# Patient Record
Sex: Male | Born: 1955 | Race: White | Hispanic: No | Marital: Married | Smoking: Former smoker
Health system: Southern US, Community
[De-identification: ages and names within clinical notes are randomized; demographics above are authoritative.]

## PROBLEM LIST (undated history)

## (undated) DIAGNOSIS — H9319 Tinnitus, unspecified ear: Secondary | ICD-10-CM

## (undated) DIAGNOSIS — E7800 Pure hypercholesterolemia, unspecified: Secondary | ICD-10-CM

## (undated) DIAGNOSIS — C679 Malignant neoplasm of bladder, unspecified: Secondary | ICD-10-CM

## (undated) DIAGNOSIS — F32A Depression, unspecified: Secondary | ICD-10-CM

## (undated) DIAGNOSIS — Z789 Other specified health status: Secondary | ICD-10-CM

## (undated) DIAGNOSIS — Z8616 Personal history of COVID-19: Secondary | ICD-10-CM

## (undated) DIAGNOSIS — K219 Gastro-esophageal reflux disease without esophagitis: Secondary | ICD-10-CM

## (undated) DIAGNOSIS — F329 Major depressive disorder, single episode, unspecified: Secondary | ICD-10-CM

## (undated) DIAGNOSIS — M1990 Unspecified osteoarthritis, unspecified site: Secondary | ICD-10-CM

## (undated) DIAGNOSIS — N281 Cyst of kidney, acquired: Secondary | ICD-10-CM

## (undated) DIAGNOSIS — Z9889 Other specified postprocedural states: Secondary | ICD-10-CM

## (undated) DIAGNOSIS — F419 Anxiety disorder, unspecified: Secondary | ICD-10-CM

## (undated) HISTORY — DX: Unspecified osteoarthritis, unspecified site: M19.90

## (undated) HISTORY — PX: ANKLE ARTHRODESIS: SUR49

## (undated) HISTORY — DX: Gastro-esophageal reflux disease without esophagitis: K21.9

## (undated) HISTORY — PX: BLADDER SURGERY: SHX569

## (undated) HISTORY — DX: Cyst of kidney, acquired: N28.1

## (undated) HISTORY — DX: Malignant neoplasm of bladder, unspecified: C67.9

## (undated) HISTORY — DX: Pure hypercholesterolemia, unspecified: E78.00

## (undated) HISTORY — PX: COLONOSCOPY: SHX174

## (undated) HISTORY — PX: RETINAL DETACHMENT SURGERY: SHX105

---

## 2009-04-25 HISTORY — PX: COLONOSCOPY: SHX174

## 2011-06-13 ENCOUNTER — Other Ambulatory Visit: Payer: Self-pay | Admitting: Orthopedic Surgery

## 2011-06-14 ENCOUNTER — Encounter (HOSPITAL_BASED_OUTPATIENT_CLINIC_OR_DEPARTMENT_OTHER): Payer: Self-pay | Admitting: *Deleted

## 2011-06-14 NOTE — Progress Notes (Signed)
No labs needed

## 2011-06-15 ENCOUNTER — Ambulatory Visit (HOSPITAL_BASED_OUTPATIENT_CLINIC_OR_DEPARTMENT_OTHER)
Admission: RE | Admit: 2011-06-15 | Discharge: 2011-06-15 | Disposition: A | Discharge status: Home / Self Care | Payer: Worker's Compensation | Source: Ambulatory Visit | Attending: Orthopedic Surgery | Admitting: Orthopedic Surgery

## 2011-06-15 ENCOUNTER — Encounter (HOSPITAL_BASED_OUTPATIENT_CLINIC_OR_DEPARTMENT_OTHER): Payer: Self-pay | Admitting: Anesthesiology

## 2011-06-15 ENCOUNTER — Ambulatory Visit (HOSPITAL_BASED_OUTPATIENT_CLINIC_OR_DEPARTMENT_OTHER): Payer: Worker's Compensation | Admitting: Anesthesiology

## 2011-06-15 ENCOUNTER — Encounter (HOSPITAL_BASED_OUTPATIENT_CLINIC_OR_DEPARTMENT_OTHER): Payer: Self-pay | Admitting: Orthopedic Surgery

## 2011-06-15 ENCOUNTER — Encounter (HOSPITAL_BASED_OUTPATIENT_CLINIC_OR_DEPARTMENT_OTHER)
Admission: RE | Disposition: A | Discharge status: Home / Self Care | Payer: Self-pay | Source: Ambulatory Visit | Attending: Orthopedic Surgery

## 2011-06-15 DIAGNOSIS — Y9269 Other specified industrial and construction area as the place of occurrence of the external cause: Secondary | ICD-10-CM | POA: Insufficient documentation

## 2011-06-15 DIAGNOSIS — IMO0002 Reserved for concepts with insufficient information to code with codable children: Secondary | ICD-10-CM | POA: Insufficient documentation

## 2011-06-15 DIAGNOSIS — W230XXA Caught, crushed, jammed, or pinched between moving objects, initial encounter: Secondary | ICD-10-CM | POA: Insufficient documentation

## 2011-06-15 DIAGNOSIS — S6710XA Crushing injury of unspecified finger(s), initial encounter: Secondary | ICD-10-CM | POA: Insufficient documentation

## 2011-06-15 DIAGNOSIS — F17200 Nicotine dependence, unspecified, uncomplicated: Secondary | ICD-10-CM | POA: Insufficient documentation

## 2011-06-15 DIAGNOSIS — Y990 Civilian activity done for income or pay: Secondary | ICD-10-CM | POA: Insufficient documentation

## 2011-06-15 HISTORY — DX: Other specified health status: Z78.9

## 2011-06-15 HISTORY — PX: ORIF FINGER FRACTURE: SHX2122

## 2011-06-15 HISTORY — DX: Depression, unspecified: F32.A

## 2011-06-15 HISTORY — DX: Major depressive disorder, single episode, unspecified: F32.9

## 2011-06-15 SURGERY — OPEN REDUCTION INTERNAL FIXATION (ORIF) METACARPAL (FINGER) FRACTURE
Anesthesia: General | Site: Finger | Laterality: Right | Wound class: Clean

## 2011-06-15 MED ORDER — HYDROCODONE-ACETAMINOPHEN 5-500 MG PO TABS: 1.0000 | ORAL_TABLET | ORAL | Status: AC | PRN

## 2011-06-15 MED ORDER — MORPHINE SULFATE 2 MG/ML IJ SOLN: 0.0500 mg/kg | INTRAMUSCULAR | Status: DC | PRN

## 2011-06-15 MED ORDER — CHLORHEXIDINE GLUCONATE 4 % EX LIQD: 60.0000 mL | Freq: Once | CUTANEOUS | Status: DC

## 2011-06-15 MED ORDER — FENTANYL CITRATE 0.05 MG/ML IJ SOLN: 25.0000 ug | INTRAMUSCULAR | Status: DC | PRN

## 2011-06-15 MED ORDER — ROPIVACAINE HCL 5 MG/ML IJ SOLN
INTRAMUSCULAR | Status: DC | PRN
  Administered 2011-06-15: 30 mL via EPIDURAL

## 2011-06-15 MED ORDER — METOCLOPRAMIDE HCL 5 MG/ML IJ SOLN: 10.0000 mg | Freq: Once | INTRAMUSCULAR | Status: DC | PRN

## 2011-06-15 MED ORDER — CEFAZOLIN SODIUM 1-5 GM-% IV SOLN
1.0000 g | INTRAVENOUS | Status: AC
  Administered 2011-06-15: 1000 mg via INTRAVENOUS

## 2011-06-15 MED ORDER — LIDOCAINE HCL (CARDIAC) 20 MG/ML IV SOLN
INTRAVENOUS | Status: DC | PRN
  Administered 2011-06-15: 40 mg via INTRAVENOUS

## 2011-06-15 MED ORDER — DEXAMETHASONE SODIUM PHOSPHATE 10 MG/ML IJ SOLN
INTRAMUSCULAR | Status: DC | PRN
  Administered 2011-06-15: 10 mg via INTRAVENOUS

## 2011-06-15 MED ORDER — 0.9 % SODIUM CHLORIDE (POUR BTL) OPTIME
TOPICAL | Status: DC | PRN
  Administered 2011-06-15: 100 mL

## 2011-06-15 MED ORDER — LACTATED RINGERS IV SOLN
INTRAVENOUS | Status: DC
  Administered 2011-06-15 (×2): via INTRAVENOUS

## 2011-06-15 MED ORDER — CEFAZOLIN SODIUM-DEXTROSE 2-3 GM-% IV SOLR: 2.0000 g | INTRAVENOUS | Status: DC

## 2011-06-15 MED ORDER — MIDAZOLAM HCL 2 MG/2ML IJ SOLN
0.5000 mg | INTRAMUSCULAR | Status: DC | PRN
  Administered 2011-06-15: 2 mg via INTRAVENOUS

## 2011-06-15 MED ORDER — EPHEDRINE SULFATE 50 MG/ML IJ SOLN
INTRAMUSCULAR | Status: DC | PRN
  Administered 2011-06-15: 10 mg via INTRAVENOUS

## 2011-06-15 MED ORDER — LIDOCAINE HCL 1 % IJ SOLN
INTRAMUSCULAR | Status: DC | PRN
  Administered 2011-06-15: 2 mL via INTRADERMAL

## 2011-06-15 MED ORDER — FENTANYL CITRATE 0.05 MG/ML IJ SOLN
50.0000 ug | INTRAMUSCULAR | Status: DC | PRN
  Administered 2011-06-15: 100 ug via INTRAVENOUS

## 2011-06-15 MED ORDER — ONDANSETRON HCL 4 MG/2ML IJ SOLN
INTRAMUSCULAR | Status: DC | PRN
  Administered 2011-06-15: 4 mg via INTRAVENOUS

## 2011-06-15 MED ORDER — PROPOFOL 10 MG/ML IV EMUL
INTRAVENOUS | Status: DC | PRN
  Administered 2011-06-15: 200 mg via INTRAVENOUS

## 2011-06-15 SURGICAL SUPPLY — 51 items
BANDAGE GAUZE ELAST BULKY 4 IN (GAUZE/BANDAGES/DRESSINGS) IMPLANT
BIT DRILL SOLID 1.1X60 (BIT) ×2 IMPLANT
BLADE MINI RND TIP GREEN BEAV (BLADE) ×2 IMPLANT
BLADE SURG 15 STRL LF DISP TIS (BLADE) ×1 IMPLANT
BLADE SURG 15 STRL SS (BLADE) ×1
BNDG COHESIVE 3X5 TAN STRL LF (GAUZE/BANDAGES/DRESSINGS) ×2 IMPLANT
BNDG ESMARK 4X9 LF (GAUZE/BANDAGES/DRESSINGS) ×2 IMPLANT
CHLORAPREP W/TINT 26ML (MISCELLANEOUS) ×2 IMPLANT
CLOTH BEACON ORANGE TIMEOUT ST (SAFETY) ×2 IMPLANT
CORDS BIPOLAR (ELECTRODE) ×2 IMPLANT
COVER MAYO STAND STRL (DRAPES) ×2 IMPLANT
COVER TABLE BACK 60X90 (DRAPES) ×2 IMPLANT
CUFF TOURNIQUET SINGLE 18IN (TOURNIQUET CUFF) ×2 IMPLANT
DECANTER SPIKE VIAL GLASS SM (MISCELLANEOUS) IMPLANT
DRAPE EXTREMITY T 121X128X90 (DRAPE) ×2 IMPLANT
DRAPE OEC MINIVIEW 54X84 (DRAPES) ×2 IMPLANT
DRAPE SURG 17X23 STRL (DRAPES) ×2 IMPLANT
GAUZE XEROFORM 1X8 LF (GAUZE/BANDAGES/DRESSINGS) ×2 IMPLANT
GLOVE BIO SURGEON STRL SZ 6.5 (GLOVE) ×2 IMPLANT
GLOVE BIOGEL PI IND STRL 8.5 (GLOVE) ×1 IMPLANT
GLOVE BIOGEL PI INDICATOR 8.5 (GLOVE) ×1
GLOVE SURG ORTHO 8.0 STRL STRW (GLOVE) ×2 IMPLANT
GOWN BRE IMP PREV XXLGXLNG (GOWN DISPOSABLE) ×2 IMPLANT
GOWN PREVENTION PLUS XLARGE (GOWN DISPOSABLE) ×2 IMPLANT
NEEDLE 27GAX1X1/2 (NEEDLE) IMPLANT
NS IRRIG 1000ML POUR BTL (IV SOLUTION) ×2 IMPLANT
PACK BASIN DAY SURGERY FS (CUSTOM PROCEDURE TRAY) ×2 IMPLANT
PAD CAST 3X4 CTTN HI CHSV (CAST SUPPLIES) ×1 IMPLANT
PADDING CAST ABS 4INX4YD NS (CAST SUPPLIES) ×1
PADDING CAST ABS COTTON 4X4 ST (CAST SUPPLIES) ×1 IMPLANT
PADDING CAST COTTON 3X4 STRL (CAST SUPPLIES) ×1
PLATE CONDYLAR 1.5MM 7H 36MM R (Plate) ×2 IMPLANT
SCREW CORTEX 1.5X12 (Screw) ×2 IMPLANT
SCREW CORTEX 1.5X18 (Screw) ×2 IMPLANT
SCREW CORTEX 1.5X9 (Screw) ×4 IMPLANT
SLEEVE SCD COMPRESS KNEE MED (MISCELLANEOUS) ×2 IMPLANT
SPLINT PLASTER CAST XFAST 3X15 (CAST SUPPLIES) ×10 IMPLANT
SPLINT PLASTER XTRA FASTSET 3X (CAST SUPPLIES) ×10
SPONGE GAUZE 4X4 12PLY (GAUZE/BANDAGES/DRESSINGS) ×2 IMPLANT
STOCKINETTE 4X48 STRL (DRAPES) ×2 IMPLANT
SUT CHROMIC 5 0 P 3 (SUTURE) IMPLANT
SUT CHROMIC 6 0 G 1 (SUTURE) ×2 IMPLANT
SUT MERSILENE 4 0 P 3 (SUTURE) IMPLANT
SUT VICRYL 4-0 PS2 18IN ABS (SUTURE) ×2 IMPLANT
SUT VICRYL RAPID 5 0 P 3 (SUTURE) IMPLANT
SUT VICRYL RAPIDE 4/0 PS 2 (SUTURE) ×2 IMPLANT
SYR BULB 3OZ (MISCELLANEOUS) ×2 IMPLANT
SYR CONTROL 10ML LL (SYRINGE) IMPLANT
TOWEL OR 17X24 6PK STRL BLUE (TOWEL DISPOSABLE) ×2 IMPLANT
UNDERPAD 30X30 INCONTINENT (UNDERPADS AND DIAPERS) ×2 IMPLANT
WATER STERILE IRR 1000ML POUR (IV SOLUTION) ×2 IMPLANT

## 2011-06-15 NOTE — Op Note (Signed)
Dictated number: [id number]

## 2011-06-15 NOTE — Anesthesia Preprocedure Evaluation (Signed)
Anesthesia Evaluation  Patient identified by MRN, date of birth, ID band Patient awake    Reviewed: Allergy & Precautions, H&P , NPO status , Patient's Chart, lab work & pertinent test results, reviewed documented beta blocker date and time   Airway Mallampati: II TM Distance: >3 FB Neck ROM: full    Dental   Pulmonary Current Smoker,          Cardiovascular neg cardio ROS     Neuro/Psych PSYCHIATRIC DISORDERS Negative Neurological ROS     GI/Hepatic negative GI ROS, Neg liver ROS,   Endo/Other  Negative Endocrine ROS  Renal/GU negative Renal ROS  Genitourinary negative   Musculoskeletal   Abdominal   Peds  Hematology negative hematology ROS (+)   Anesthesia Other Findings See surgeon's H&P   Reproductive/Obstetrics negative OB ROS                           Anesthesia Physical Anesthesia Plan  ASA: II  Anesthesia Plan: General   Post-op Pain Management: MAC Combined w/ Regional for Post-op pain   Induction: Intravenous  Airway Management Planned: LMA  Additional Equipment:   Intra-op Plan:   Post-operative Plan: Extubation in OR  Informed Consent: I have reviewed the patients History and Physical, chart, labs and discussed the procedure including the risks, benefits and alternatives for the proposed anesthesia with the patient or authorized representative who has indicated his/her understanding and acceptance.     Plan Discussed with: CRNA and Surgeon  Anesthesia Plan Comments:         Anesthesia Quick Evaluation

## 2011-06-15 NOTE — Discharge Instructions (Signed)
[facility]  [address] [place], [place] [address] [phone]   Post Anesthesia Home Care Instructions  Activity: Get plenty of rest for the remainder of the day. A responsible adult should stay with you for 24 hours following the procedure.  For the next 24 hours, DO NOT: -Drive a car -Advertising copywriter -Drink alcoholic beverages -Take any medication unless instructed by your physician -Make any legal decisions or sign important papers.  Meals: Start with liquid foods such as gelatin or soup. Progress to regular foods as tolerated. Avoid greasy, spicy, heavy foods. If nausea and/or vomiting occur, drink only clear liquids until the nausea and/or vomiting subsides. Call your physician if vomiting continues.  Special Instructions/Symptoms: Your throat may feel dry or sore from the anesthesia or the breathing tube placed in your throat during surgery. If this causes discomfort, gargle with warm salt water. The discomfort should disappear within 24 hours.        HAND SURGERY    HOME CARE INSTRUCTIONS    The following instructions have been prepared to help you care for yourself upon your return home today.  Wound Care:  Keep your hand elevated above the level of your heart. Do not allow it to dangle by your side. Keep the dressing dry and do not remove it unless your doctor advises you to do so. He will usually change it at the time of you post-op visit. Moving your fingers is advised to stimulate circulation but will depend on the site of your surgery. Of course, if you have a splint applied your doctor will advise you about movement.  Activity:  Do not drive or operate machinery today. Rest today and then you may return to your normal activity and work as indicated by your physician.  Diet: Drink liquids today or eat a light diet. You may resume a regular diet tomorrow.  General expectations: Pain for two or three days. Fingers may become slightly  swollen.   Unexpected Observations- Call your doctor if any of these occur: Severe pain not relieved by pain medication. Elevated temperature. Dressing soaked with blood. Inability to move fingers. White or bluish color to fingers.  Return to office: ***     Regional Anesthesia Blocks  1. Numbness or the inability to move the "blocked" extremity may last from 3-48 hours after placement. The length of time depends on the medication injected and your individual response to the medication. If the numbness is not going away after 48 hours, call your surgeon.  2. The extremity that is blocked will need to be protected until the numbness is gone and the  Strength has returned. Because you cannot feel it, you will need to take extra care to avoid injury. Because it may be weak, you may have difficulty moving it or using it. You may not know what position it is in without looking at it while the block is in effect.  3. For blocks in the legs and feet, returning to weight bearing and walking needs to be done carefully. You will need to wait until the numbness is entirely gone and the strength has returned. You should be able to move your leg and foot normally before you try and bear weight or walk. You will need someone to be with you when you first try to ensure you do not fall and possibly risk injury.  4. Bruising and tenderness at the needle site are common side effects and will resolve in a few  days.  5. Persistent numbness or new problems with movement should be communicated to the surgeon or the [facility] [phone]).

## 2011-06-15 NOTE — Anesthesia Postprocedure Evaluation (Signed)
Anesthesia Post Note  Patient: Roger Vega  Procedure(s) Performed: Procedure(s) (LRB): OPEN REDUCTION INTERNAL FIXATION (ORIF) METACARPAL (FINGER) FRACTURE (Right)  Anesthesia type: General  Patient location: PACU  Post pain: Pain level controlled  Post assessment: Patient's Cardiovascular Status Stable  Last Vitals:  Filed Vitals:   06/15/11 1645  BP: 138/95  Pulse: 79  Temp:   Resp: 19    Post vital signs: Reviewed and stable  Level of consciousness: alert  Complications: No apparent anesthesia complications

## 2011-06-15 NOTE — Progress Notes (Signed)
Assisted Dr. Frederick with right, ultrasound guided, supraclavicular block. Side rails up, monitors on throughout procedure. See vital signs in flow sheet. Tolerated Procedure well. 

## 2011-06-15 NOTE — Brief Op Note (Signed)
06/15/2011  3:56 PM  PATIENT:  Roger Vega  56 y.o. male  PRE-OPERATIVE DIAGNOSIS:  Fracture proximal phalanx right little finger  POST-OPERATIVE DIAGNOSIS:  Fracture proximal phalanx right little finger  PROCEDURE:  Procedure(s) (LRB): OPEN REDUCTION INTERNAL FIXATION (ORIF) METACARPAL (FINGER) FRACTURE (Right)  SURGEON:  Surgeon(s) and Role:    * Nicki Reaper, MD - Primary  PHYSICIAN ASSISTANT:   ASSISTANTS: none   ANESTHESIA:   none  EBL:  Total I/O In: 1000 [I.V.:1000] Out: -   BLOOD ADMINISTERED:none  DRAINS: none   LOCAL MEDICATIONS USED:  NONE  SPECIMEN:  No Specimen  DISPOSITION OF SPECIMEN:  N/A  COUNTS:  YES  TOURNIQUET:   Total Tourniquet Time Documented: Upper Arm (Right) - 49 minutes  DICTATION: .Other Dictation: Dictation Number [id number]  PLAN OF CARE: Discharge to home after PACU  PATIENT DISPOSITION:  PACU - hemodynamically stable.

## 2011-06-15 NOTE — Op Note (Signed)

## 2011-06-15 NOTE — H&P (Signed)
Roger Vega is a 56 year-old right-hand dominant male referred by Dr. Thurston Hole for consultation with respect to an injury to his right little finger. He suffered a crush injury when it was caught between the bed of a truck and a steel plate.  The injury occurred on 06/03/11.  He was originally seen at [facility] and referred to Dr. Thurston Hole for consultation.  X-rays reveal a fracture of his proximal phalanx of his right little finger with angulation.  He has no prior history of injuries; he has no history of diabetes, thyroid problems, arthritis or gout.  There is a family history of diabetes, he has been tested.  He complains of an intermittent, mild aching type pain with a feeling of swelling.  He has a prior history of injury to the finger with the inability to fully extend with a loss of approximately 55 degrees to the PIP joint. He maintains the finger in hyperextended position at this point in time at the metacarpophalangeal joint with a flexed position of the PIP joint.   ALLERGIES:   Nonsteroidal anti-inflammatories.    MEDICATIONS:    Zoloft.  SURGICAL HISTORY:    Ankle surgery 13 years ago.    FAMILY MEDICAL HISTORY:   Positive for diabetes, heart disease and high blood pressure.  SOCIAL HISTORY:     He does not smoke or drink.  He is married.  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.  REVIEW OF SYSTEMS:   Positive for ringing in his ears, glasses, otherwise negative 14 points.  Roger Vega is an 56 y.o. male.   Chief Complaint: fracture Rt little finger HPI: see above  Past Medical History  Diagnosis Date  . Depression   . No pertinent past medical history     Past Surgical History  Procedure Date  . Ankle arthrodesis     age 45-bone frag,cyst-rt  . Colonoscopy     History reviewed. No pertinent family history. Social History:  reports that he quit smoking about 35 years ago. He does not have any smokeless tobacco history on file. He reports that he does not drink alcohol  or use illicit drugs.  Allergies:  Allergies  Allergen Reactions  . Nsaids Anaphylaxis    No current facility-administered medications on file as of .   Medications Prior to Admission  Medication Sig Dispense Refill  . acetaminophen (TYLENOL) 325 MG tablet Take 650 mg by mouth every 6 (six) hours as needed.      . Multiple Vitamin (MULTIVITAMIN) capsule Take 1 capsule by mouth daily.      . sertraline (ZOLOFT) 100 MG tablet Take 100 mg by mouth daily.        No results found for this or any previous visit (from the past 48 hour(s)).  No results found.   Pertinent items are noted in HPI.  Height 5\' 11"  (1.803 m), weight 77.111 kg (170 lb).  General appearance: alert, cooperative and appears stated age Head: Normocephalic, without obvious abnormality Neck: no adenopathy Resp: clear to auscultation bilaterally Cardio: regular rate and rhythm, S1, S2 normal, no murmur, click, rub or gallop GI: soft, non-tender; bowel sounds normal; no masses,  no organomegaly Extremities: extremities normal, atraumatic, no cyanosis or edema Pulses: 2+ and symmetric Skin: Skin color, texture, turgor normal. No rashes or lesions Neurologic: Grossly normal Incision/Wound: na  Assessment/Plan  We would recommend fixation of this with open reduction, internal fixation bone grafting. This can be performed as an outpatient. He is aware that  there is no guarantee with the surgery, the possibility of infection, recurrence, injury to arteries, nerves, tendons, he is aware that he will not regain any increased mobility at the PIP joint in extension due to the old injury, that we are attempting to maintain the flexibility of his finger and prevent further flexion deformity to the little finger.  He is in agreement with this approach.  This will be scheduled as an outpatient for ORIF allograft to the little finger of his right hand.  Roger Vega R 06/15/2011, 9:42 AM

## 2011-06-15 NOTE — Anesthesia Procedure Notes (Addendum)
Anesthesia Regional Block:  Supraclavicular block  Pre-Anesthetic Checklist: ,, timeout performed, Correct Patient, Correct Site, Correct Laterality, Correct Procedure, Correct Position, site marked, Risks and benefits discussed,  Surgical consent,  Pre-op evaluation,  At surgeon's request and post-op pain management  Laterality: Right  Prep: chloraprep       Needles:   Needle Type: Other   (Arrow Echogenic)   Needle Length: 9cm  Needle Gauge: 21    Additional Needles:  Procedures: ultrasound guided Supraclavicular block Narrative:  Start time: 06/15/2011 1:48 PM End time: 06/15/2011 1:56 PM Injection made incrementally with aspirations every 5 mL.  Performed by: Personally  Anesthesiologist: C Frederick  Additional Notes: Ultrasound guidance used to: id relevant anatomy, confirm needle position, local anesthetic spread, avoidance of vascular puncture. Picture saved. No complications. Block performed personally by Janetta Hora. Gelene Mink, MD    Supraclavicular block Procedure Name: LMA Insertion Date/Time: 06/15/2011 2:51 PM Performed by: Radford Pax Pre-anesthesia Checklist: Patient identified, Emergency Drugs available, Suction available and Patient being monitored Patient Re-evaluated:Patient Re-evaluated prior to inductionOxygen Delivery Method: Circle System Utilized Preoxygenation: Pre-oxygenation with 100% oxygen Intubation Type: IV induction Ventilation: Mask ventilation without difficulty LMA: LMA inserted LMA Size: 5.0 Number of attempts: 1 Airway Equipment and Method: bite block Placement Confirmation: positive ETCO2 Tube secured with: Tape Dental Injury: Teeth and Oropharynx as per pre-operative assessment

## 2011-06-15 NOTE — Transfer of Care (Signed)
Immediate Anesthesia Transfer of Care Note  Patient: Roger Vega  Procedure(s) Performed: Procedure(s) (LRB): OPEN REDUCTION INTERNAL FIXATION (ORIF) METACARPAL (FINGER) FRACTURE (Right)  Patient Location: PACU  Anesthesia Type: General and Regional  Level of Consciousness: awake, alert  and oriented  Airway & Oxygen Therapy: Patient Spontanous Breathing and Patient connected to face mask oxygen  Post-op Assessment: Report given to PACU RN and Post -op Vital signs reviewed and stable  Post vital signs: Reviewed and stable  Complications: No apparent anesthesia complications

## 2011-06-16 ENCOUNTER — Encounter (HOSPITAL_BASED_OUTPATIENT_CLINIC_OR_DEPARTMENT_OTHER): Payer: Self-pay | Admitting: Orthopedic Surgery

## 2011-06-16 NOTE — Op Note (Signed)
NAME:  Roger Vega, Roger Vega                  ACCOUNT NO.:  MEDICAL RECORD NO.:  [id number]  LOCATION:                                 FACILITY:  PHYSICIAN:  Cindee Salt, M.D.            DATE OF BIRTH:  DATE OF PROCEDURE:  06/15/2011 DATE OF DISCHARGE:                              OPERATIVE REPORT   PREOPERATIVE DIAGNOSIS:  Fractured proximal phalanx, right little finger.  POSTOPERATIVE DIAGNOSIS:  Fractured proximal phalanx, right little finger.  OPERATION:  Open reduction and internal fixation, right little finger proximal phalanx.  SURGEON:  Cindee Salt, MD  ANESTHESIA:  Axillary block general.  ANESTHESIOLOGIST:  Janetta Hora. Gelene Mink, MD.  HISTORY:  The patient is a 56 year old male with the history of a fracture of his proximal phalanx, right little finger.  This is angulated, displaced.  He was admitted for open reduction and internal fixation.  Pre, peri, and postoperative course have been discussed along with risks and complications.  He is aware that there is no guarantee with the surgery, possibility of infection, recurrence of injury to arteries, nerves, tendons, incomplete relief of symptoms, and dystrophy. In the preoperative area, the patient is seen, the extremity marked by both the patient and surgeon, and antibiotic given.  PROCEDURE:  The patient was brought to the operating room.  An axillary block carried out without difficulty.  A general anesthetic was given. He was prepped using ChloraPrep, supine position, right arm free.  A 3- minute dry time was allowed.  Time-out taken, confirming the patient and procedure.  The limb was exsanguinated with an Esmarch bandage. Tourniquet was placed high and the arm was inflated to 250 mmHg.  A curvilinear incision was made over the proximal phalanx, metacarpophalangeal joint, carried down through subcutaneous tissue. This was mid lateral in nature.  The lateral band was incised.  The periosteum was incised.  The  fracture immediately apparent.  This was partially healed.  This was pried apart with a Educational psychologist of the fracture.  This required freeing up the entire periosteum around the entire proximal phalanx.  A 1.5-mm blade plate was then selected.  This was measured, cut to a length.  This was placed. The drill hole was made for the blade.  This was positioned on the dorsal aspect of the proximal articular surface as a buttress.  This was placed, measured at 16 mm and cut to length.  X-rays confirmed positioning of the plate with the proper length.  The distal screws were then placed after maintaining the finger in a reduced position with no rotation or angulatory deformity with full flexion to the fingers. Distal screws were placed, these measured 12 and 9 mm.  These 3 screws were placed.  This firmly fixed the distal fragment in position.  The proximal final screw was placed, this was 18 mm.  These were all 1.5-mm screws.  This firmly fixed the fracture in position.  The wound was irrigated.  X-rays confirmed anatomic reduction of the fracture fragment.  The periosteum was then closed with a running 6-0 chromic suture.  The extensor tendon was repaired with figure-of-eight 4-0 Vicryl  sutures and the skin with interrupted 4-0 Vicryl Rapide sutures.  A sterile compressive dressing and splint was applied to the ring and little finger.  The patient tolerated the procedure well.  On deflation of the tourniquet, all fingers were immediately pinked.  He was taken to the recovery room for observation in satisfactory condition.          ______________________________ Cindee Salt, M.D.     GK/MEDQ  D:  06/15/2011  T:  06/16/2011  Job:  [id number]

## 2014-04-25 DIAGNOSIS — C679 Malignant neoplasm of bladder, unspecified: Secondary | ICD-10-CM

## 2014-04-25 HISTORY — PX: OTHER SURGICAL HISTORY: SHX169

## 2014-04-25 HISTORY — DX: Malignant neoplasm of bladder, unspecified: C67.9

## 2014-04-25 HISTORY — PX: RETINAL DETACHMENT SURGERY: SHX105

## 2015-04-26 DIAGNOSIS — M5126 Other intervertebral disc displacement, lumbar region: Secondary | ICD-10-CM

## 2015-04-26 HISTORY — DX: Other intervertebral disc displacement, lumbar region: M51.26

## 2015-07-28 DIAGNOSIS — M5126 Other intervertebral disc displacement, lumbar region: Secondary | ICD-10-CM | POA: Diagnosis not present

## 2015-07-30 DIAGNOSIS — M5126 Other intervertebral disc displacement, lumbar region: Secondary | ICD-10-CM | POA: Diagnosis not present

## 2015-09-01 DIAGNOSIS — E782 Mixed hyperlipidemia: Secondary | ICD-10-CM | POA: Diagnosis not present

## 2015-09-01 DIAGNOSIS — M1991 Primary osteoarthritis, unspecified site: Secondary | ICD-10-CM | POA: Diagnosis not present

## 2015-09-01 DIAGNOSIS — C672 Malignant neoplasm of lateral wall of bladder: Secondary | ICD-10-CM | POA: Diagnosis not present

## 2015-09-07 DIAGNOSIS — C672 Malignant neoplasm of lateral wall of bladder: Secondary | ICD-10-CM | POA: Diagnosis not present

## 2015-09-07 DIAGNOSIS — M1991 Primary osteoarthritis, unspecified site: Secondary | ICD-10-CM | POA: Diagnosis not present

## 2015-09-07 DIAGNOSIS — M5416 Radiculopathy, lumbar region: Secondary | ICD-10-CM | POA: Diagnosis not present

## 2015-09-07 DIAGNOSIS — E782 Mixed hyperlipidemia: Secondary | ICD-10-CM | POA: Diagnosis not present

## 2015-09-15 DIAGNOSIS — M5126 Other intervertebral disc displacement, lumbar region: Secondary | ICD-10-CM | POA: Diagnosis not present

## 2016-03-12 DIAGNOSIS — S39012A Strain of muscle, fascia and tendon of lower back, initial encounter: Secondary | ICD-10-CM | POA: Diagnosis not present

## 2016-03-12 DIAGNOSIS — M5416 Radiculopathy, lumbar region: Secondary | ICD-10-CM | POA: Diagnosis not present

## 2016-03-12 DIAGNOSIS — Z6825 Body mass index (BMI) 25.0-25.9, adult: Secondary | ICD-10-CM | POA: Diagnosis not present

## 2016-03-16 DIAGNOSIS — Z0001 Encounter for general adult medical examination with abnormal findings: Secondary | ICD-10-CM | POA: Diagnosis not present

## 2016-03-21 DIAGNOSIS — M5136 Other intervertebral disc degeneration, lumbar region: Secondary | ICD-10-CM | POA: Diagnosis not present

## 2016-03-21 DIAGNOSIS — Z6824 Body mass index (BMI) 24.0-24.9, adult: Secondary | ICD-10-CM | POA: Diagnosis not present

## 2016-03-21 DIAGNOSIS — Z0001 Encounter for general adult medical examination with abnormal findings: Secondary | ICD-10-CM | POA: Diagnosis not present

## 2016-03-21 DIAGNOSIS — Z1389 Encounter for screening for other disorder: Secondary | ICD-10-CM | POA: Diagnosis not present

## 2016-03-21 DIAGNOSIS — M5126 Other intervertebral disc displacement, lumbar region: Secondary | ICD-10-CM | POA: Diagnosis not present

## 2016-03-23 ENCOUNTER — Other Ambulatory Visit: Payer: Self-pay | Admitting: Neurosurgery

## 2016-03-23 DIAGNOSIS — M5126 Other intervertebral disc displacement, lumbar region: Secondary | ICD-10-CM

## 2016-05-17 DIAGNOSIS — C679 Malignant neoplasm of bladder, unspecified: Secondary | ICD-10-CM | POA: Diagnosis not present

## 2016-05-17 DIAGNOSIS — R8290 Unspecified abnormal findings in urine: Secondary | ICD-10-CM | POA: Diagnosis not present

## 2016-09-21 DIAGNOSIS — M1991 Primary osteoarthritis, unspecified site: Secondary | ICD-10-CM | POA: Diagnosis not present

## 2016-09-21 DIAGNOSIS — E782 Mixed hyperlipidemia: Secondary | ICD-10-CM | POA: Diagnosis not present

## 2016-09-21 DIAGNOSIS — F4001 Agoraphobia with panic disorder: Secondary | ICD-10-CM | POA: Diagnosis not present

## 2016-09-21 DIAGNOSIS — C672 Malignant neoplasm of lateral wall of bladder: Secondary | ICD-10-CM | POA: Diagnosis not present

## 2016-09-21 DIAGNOSIS — N400 Enlarged prostate without lower urinary tract symptoms: Secondary | ICD-10-CM | POA: Diagnosis not present

## 2016-09-27 DIAGNOSIS — C672 Malignant neoplasm of lateral wall of bladder: Secondary | ICD-10-CM | POA: Diagnosis not present

## 2016-09-27 DIAGNOSIS — F4001 Agoraphobia with panic disorder: Secondary | ICD-10-CM | POA: Diagnosis not present

## 2016-09-27 DIAGNOSIS — E782 Mixed hyperlipidemia: Secondary | ICD-10-CM | POA: Diagnosis not present

## 2016-09-27 DIAGNOSIS — H9313 Tinnitus, bilateral: Secondary | ICD-10-CM | POA: Diagnosis not present

## 2017-03-22 DIAGNOSIS — Z0001 Encounter for general adult medical examination with abnormal findings: Secondary | ICD-10-CM | POA: Diagnosis not present

## 2017-03-28 DIAGNOSIS — Z0001 Encounter for general adult medical examination with abnormal findings: Secondary | ICD-10-CM | POA: Diagnosis not present

## 2017-03-28 DIAGNOSIS — Z6825 Body mass index (BMI) 25.0-25.9, adult: Secondary | ICD-10-CM | POA: Diagnosis not present

## 2017-06-13 ENCOUNTER — Ambulatory Visit: Payer: BLUE CROSS/BLUE SHIELD | Admitting: Urology

## 2017-09-21 DIAGNOSIS — M48061 Spinal stenosis, lumbar region without neurogenic claudication: Secondary | ICD-10-CM | POA: Diagnosis not present

## 2017-09-21 DIAGNOSIS — Z1331 Encounter for screening for depression: Secondary | ICD-10-CM | POA: Diagnosis not present

## 2017-09-21 DIAGNOSIS — E782 Mixed hyperlipidemia: Secondary | ICD-10-CM | POA: Diagnosis not present

## 2017-09-21 DIAGNOSIS — M5416 Radiculopathy, lumbar region: Secondary | ICD-10-CM | POA: Diagnosis not present

## 2017-09-21 DIAGNOSIS — Z1389 Encounter for screening for other disorder: Secondary | ICD-10-CM | POA: Diagnosis not present

## 2017-09-21 DIAGNOSIS — C672 Malignant neoplasm of lateral wall of bladder: Secondary | ICD-10-CM | POA: Diagnosis not present

## 2018-03-30 DIAGNOSIS — Z0000 Encounter for general adult medical examination without abnormal findings: Secondary | ICD-10-CM | POA: Diagnosis not present

## 2018-04-05 DIAGNOSIS — Z6824 Body mass index (BMI) 24.0-24.9, adult: Secondary | ICD-10-CM | POA: Diagnosis not present

## 2018-04-05 DIAGNOSIS — Z0001 Encounter for general adult medical examination with abnormal findings: Secondary | ICD-10-CM | POA: Diagnosis not present

## 2018-04-07 DIAGNOSIS — H2513 Age-related nuclear cataract, bilateral: Secondary | ICD-10-CM | POA: Diagnosis not present

## 2018-04-07 DIAGNOSIS — H524 Presbyopia: Secondary | ICD-10-CM | POA: Diagnosis not present

## 2018-04-07 DIAGNOSIS — H52222 Regular astigmatism, left eye: Secondary | ICD-10-CM | POA: Diagnosis not present

## 2018-04-07 DIAGNOSIS — H5201 Hypermetropia, right eye: Secondary | ICD-10-CM | POA: Diagnosis not present

## 2018-06-19 ENCOUNTER — Ambulatory Visit: Payer: BLUE CROSS/BLUE SHIELD | Admitting: Urology

## 2018-06-19 DIAGNOSIS — C678 Malignant neoplasm of overlapping sites of bladder: Secondary | ICD-10-CM

## 2018-09-27 DIAGNOSIS — E782 Mixed hyperlipidemia: Secondary | ICD-10-CM | POA: Diagnosis not present

## 2018-09-27 DIAGNOSIS — C672 Malignant neoplasm of lateral wall of bladder: Secondary | ICD-10-CM | POA: Diagnosis not present

## 2018-10-01 DIAGNOSIS — E782 Mixed hyperlipidemia: Secondary | ICD-10-CM | POA: Diagnosis not present

## 2018-10-01 DIAGNOSIS — M48061 Spinal stenosis, lumbar region without neurogenic claudication: Secondary | ICD-10-CM | POA: Diagnosis not present

## 2018-10-01 DIAGNOSIS — F4001 Agoraphobia with panic disorder: Secondary | ICD-10-CM | POA: Diagnosis not present

## 2018-10-01 DIAGNOSIS — C672 Malignant neoplasm of lateral wall of bladder: Secondary | ICD-10-CM | POA: Diagnosis not present

## 2018-10-13 DIAGNOSIS — L03114 Cellulitis of left upper limb: Secondary | ICD-10-CM | POA: Diagnosis not present

## 2018-10-13 DIAGNOSIS — M7022 Olecranon bursitis, left elbow: Secondary | ICD-10-CM | POA: Diagnosis not present

## 2018-10-25 DIAGNOSIS — Z6825 Body mass index (BMI) 25.0-25.9, adult: Secondary | ICD-10-CM | POA: Diagnosis not present

## 2018-10-25 DIAGNOSIS — C672 Malignant neoplasm of lateral wall of bladder: Secondary | ICD-10-CM | POA: Diagnosis not present

## 2018-10-25 DIAGNOSIS — M7022 Olecranon bursitis, left elbow: Secondary | ICD-10-CM | POA: Diagnosis not present

## 2019-04-06 DIAGNOSIS — Z0000 Encounter for general adult medical examination without abnormal findings: Secondary | ICD-10-CM | POA: Diagnosis not present

## 2019-04-06 DIAGNOSIS — Z13220 Encounter for screening for lipoid disorders: Secondary | ICD-10-CM | POA: Diagnosis not present

## 2019-04-09 DIAGNOSIS — Z0001 Encounter for general adult medical examination with abnormal findings: Secondary | ICD-10-CM | POA: Diagnosis not present

## 2019-04-09 DIAGNOSIS — F4001 Agoraphobia with panic disorder: Secondary | ICD-10-CM | POA: Diagnosis not present

## 2019-04-09 DIAGNOSIS — C672 Malignant neoplasm of lateral wall of bladder: Secondary | ICD-10-CM | POA: Diagnosis not present

## 2019-04-09 DIAGNOSIS — R972 Elevated prostate specific antigen [PSA]: Secondary | ICD-10-CM | POA: Diagnosis not present

## 2019-04-09 DIAGNOSIS — M7022 Olecranon bursitis, left elbow: Secondary | ICD-10-CM | POA: Diagnosis not present

## 2019-04-24 ENCOUNTER — Other Ambulatory Visit: Payer: Self-pay

## 2019-04-24 ENCOUNTER — Other Ambulatory Visit: Payer: Self-pay | Admitting: Urology

## 2019-04-26 HISTORY — PX: OTHER SURGICAL HISTORY: SHX169

## 2019-06-20 DIAGNOSIS — M47816 Spondylosis without myelopathy or radiculopathy, lumbar region: Secondary | ICD-10-CM | POA: Diagnosis not present

## 2019-06-20 DIAGNOSIS — M5442 Lumbago with sciatica, left side: Secondary | ICD-10-CM | POA: Diagnosis not present

## 2019-06-20 DIAGNOSIS — S338XXA Sprain of other parts of lumbar spine and pelvis, initial encounter: Secondary | ICD-10-CM | POA: Diagnosis not present

## 2019-06-25 DIAGNOSIS — S338XXA Sprain of other parts of lumbar spine and pelvis, initial encounter: Secondary | ICD-10-CM | POA: Diagnosis not present

## 2019-06-25 DIAGNOSIS — M47816 Spondylosis without myelopathy or radiculopathy, lumbar region: Secondary | ICD-10-CM | POA: Diagnosis not present

## 2019-06-25 DIAGNOSIS — M5442 Lumbago with sciatica, left side: Secondary | ICD-10-CM | POA: Diagnosis not present

## 2019-07-02 ENCOUNTER — Ambulatory Visit: Payer: BLUE CROSS/BLUE SHIELD | Admitting: Urology

## 2019-07-03 DIAGNOSIS — M5442 Lumbago with sciatica, left side: Secondary | ICD-10-CM | POA: Diagnosis not present

## 2019-07-03 DIAGNOSIS — S338XXA Sprain of other parts of lumbar spine and pelvis, initial encounter: Secondary | ICD-10-CM | POA: Diagnosis not present

## 2019-07-03 DIAGNOSIS — M47816 Spondylosis without myelopathy or radiculopathy, lumbar region: Secondary | ICD-10-CM | POA: Diagnosis not present

## 2019-08-20 ENCOUNTER — Ambulatory Visit: Payer: BLUE CROSS/BLUE SHIELD | Admitting: Urology

## 2019-08-27 ENCOUNTER — Ambulatory Visit: Payer: BC Managed Care – PPO | Admitting: Urology

## 2019-08-27 ENCOUNTER — Other Ambulatory Visit: Payer: Self-pay

## 2019-08-27 ENCOUNTER — Encounter: Payer: Self-pay | Admitting: Urology

## 2019-08-27 VITALS — BP 140/82 | HR 79 | Temp 97.5°F | Ht 71.0 in | Wt 180.0 lb

## 2019-08-27 DIAGNOSIS — R972 Elevated prostate specific antigen [PSA]: Secondary | ICD-10-CM

## 2019-08-27 DIAGNOSIS — Z8551 Personal history of malignant neoplasm of bladder: Secondary | ICD-10-CM | POA: Diagnosis not present

## 2019-08-27 LAB — POCT URINALYSIS DIPSTICK
Bilirubin, UA: NEGATIVE
Blood, UA: NEGATIVE
Glucose, UA: NEGATIVE
Ketones, UA: NEGATIVE
Leukocytes, UA: NEGATIVE
Nitrite, UA: NEGATIVE
Protein, UA: NEGATIVE
Spec Grav, UA: <=1.005 — AB (ref 1.010–1.025)
Urobilinogen, UA: NEGATIVE E.U./dL — AB
pH, UA: 5 (ref 5.0–8.0)

## 2019-08-27 LAB — PSA: PSA: 6.7 ng/mL — ABNORMAL HIGH (ref <=4.0)

## 2019-08-27 MED ORDER — CIPROFLOXACIN HCL 500 MG PO TABS
500.0000 mg | ORAL_TABLET | Freq: Once | ORAL | Status: AC
  Administered 2019-08-27: 11:00:00 500 mg via ORAL

## 2019-08-27 NOTE — Progress Notes (Signed)
See progress notes

## 2019-08-27 NOTE — Progress Notes (Signed)
H&P  Chief Complaint: Bladder Cancer  History of Present Illness:   5.4.2021: Here today for f/u with cysto. No report of hematuria or voiding issues over the past year.  12.14.2020: PSA in Dr Burdine's office 5.9  (below copied from [facility] records):  Bladder Cancer:  Roger Vega is a 64 year-old male established patient who is here for follow-up of bladder cancer treatment.  Originally sent by Dr. Pleas Koch for evaluation and management/follow-up of bladder cancer. He underwent initial TURBT on 3,.15.2016. 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 Muscle was not present in the initial resection. He was given mitomycin after the initial resection. Repeat resection on 4.25.2016 revealed reactive appearing cells, with a papilloma and no evidence of recurrence.   His last surveillance cystoscopy by Dr. Exie Parody was performed on 1.23.2018.   2.25.2020: No recent voiding symptoms or gross hematuria.   Renal Cyst:  The problem is on both sides. He is not having pain. He is not currently having flank pain, back pain, groin pain, nausea, vomiting, fever or chills.   He does not have a history of urinary infections. He has not had kidney surgery.   Prior to his TURBT, he had contrasted CT scan revealing bilateral renal cysts with a hyperdense cyst in the upper pole.   Past Medical History:  Diagnosis Date  . Depression   . No pertinent past medical history     Past Surgical History:  Procedure Laterality Date  . ANKLE ARTHRODESIS     age 50-bone frag,cyst-rt  . COLONOSCOPY    . ORIF FINGER FRACTURE  06/15/2011   Procedure: OPEN REDUCTION INTERNAL FIXATION (ORIF) METACARPAL (FINGER) FRACTURE;  Surgeon: Wynonia Sours, MD;  Location: [place];  Service: Orthopedics;  Laterality: Right;  Proximal phalanx right little finger    Home Medications:  Allergies as of 08/27/2019      Reactions   Nsaids Anaphylaxis      Medication  List       Accurate as of Aug 27, 2019 10:56 AM. If you have any questions, ask your nurse or doctor.        acetaminophen 325 MG tablet Commonly known as: TYLENOL Take 650 mg by mouth every 6 (six) hours as needed.   atorvastatin 40 MG tablet Commonly known as: LIPITOR Take 40 mg by mouth daily.   multivitamin capsule Take 1 capsule by mouth daily.   sertraline 100 MG tablet Commonly known as: ZOLOFT Take 100 mg by mouth daily.       Allergies:  Allergies  Allergen Reactions  . Nsaids Anaphylaxis    No family history on file.  Social History:  reports that he quit smoking about 43 years ago. He has never used smokeless tobacco. He reports that he does not drink alcohol or use drugs.  ROS: Urological Symptom Review  Patient is experiencing the following symptoms: None   Review of System  Gastrointestinal (upper)  : Negative for upper GI symptoms Gastrointestinal (lower) : Negative for lower GI symptoms Constitutional : Negative for symptoms Skin: Negative for skin symptoms Eyes: Negative for eye symptoms Ear/Nose/Throat : Negative for Ear/Nose/Throat symptoms Hematologic/Lymphatic: Negative for Hematologic/Lymphatic symptoms Cardiovascular : Negative for cardiovascular symptoms Respiratory : Negative for respiratory symptoms Endocrine: Negative for endocrine symptoms Musculoskeletal: Negative for musculoskeletal symptoms Neurological: Negative for neurological symptoms Psychologic: Negative for psychiatric symptoms   Physical Exam:  Vital signs in last 24 hours: BP 140/82   Pulse 79   Temp (!)  97.5 F (36.4 C)   Ht 5\' 11"  (1.803 m)   Wt 180 lb (81.6 kg)   BMI 25.10 kg/m  Constitutional:  Alert and oriented, No acute distress Cardiovascular: Regular rate  Respiratory: Normal respiratory effort GI: Abdomen is soft, nontender, nondistended, no abdominal masses. No CVAT.  Genitourinary: Normal male phallus, testes are descended  bilaterally and non-tender and without masses, scrotum is normal in appearance without lesions or masses, perineum is normal on inspection. Rectal--prostate 60 gms, benign Lymphatic: No lymphadenopathy Neurologic: Grossly intact, no focal deficits Psychiatric: Normal mood and affect  Cystoscopy Procedure Note:  Indication: F/U bladder cancer  After informed consent and discussion of the procedure and its risks, [name] was positioned and prepped in the standard fashion. Cystoscopy was performed with a flexible cystoscope. The urethra, bladder neck and entire bladder was visualized in a standard fashion. The prostate was moderately obstructing.. The ureteral orifices were visualized in their normal location and orientation. Bladder urothelium nml--no calculi.     Impression/Assessment:  1. H/O TCCa bladder w/o recurrence seen today  2. Elevated PSA w/ benign exam  Plan:  1. PSA today--if still elevated will need TRUS/BX  2. OV w/ cysto in 1 year

## 2019-10-07 DIAGNOSIS — E782 Mixed hyperlipidemia: Secondary | ICD-10-CM | POA: Diagnosis not present

## 2019-10-16 DIAGNOSIS — Z6825 Body mass index (BMI) 25.0-25.9, adult: Secondary | ICD-10-CM | POA: Diagnosis not present

## 2019-10-16 DIAGNOSIS — Z1331 Encounter for screening for depression: Secondary | ICD-10-CM | POA: Diagnosis not present

## 2019-10-16 DIAGNOSIS — E782 Mixed hyperlipidemia: Secondary | ICD-10-CM | POA: Diagnosis not present

## 2019-10-16 DIAGNOSIS — C672 Malignant neoplasm of lateral wall of bladder: Secondary | ICD-10-CM | POA: Diagnosis not present

## 2019-10-16 DIAGNOSIS — R972 Elevated prostate specific antigen [PSA]: Secondary | ICD-10-CM | POA: Diagnosis not present

## 2019-10-16 DIAGNOSIS — Z1389 Encounter for screening for other disorder: Secondary | ICD-10-CM | POA: Diagnosis not present

## 2020-04-10 DIAGNOSIS — Z0001 Encounter for general adult medical examination with abnormal findings: Secondary | ICD-10-CM | POA: Diagnosis not present

## 2020-04-10 DIAGNOSIS — E782 Mixed hyperlipidemia: Secondary | ICD-10-CM | POA: Diagnosis not present

## 2020-04-10 DIAGNOSIS — Z1329 Encounter for screening for other suspected endocrine disorder: Secondary | ICD-10-CM | POA: Diagnosis not present

## 2020-04-14 DIAGNOSIS — C672 Malignant neoplasm of lateral wall of bladder: Secondary | ICD-10-CM | POA: Diagnosis not present

## 2020-04-14 DIAGNOSIS — F4001 Agoraphobia with panic disorder: Secondary | ICD-10-CM | POA: Diagnosis not present

## 2020-04-14 DIAGNOSIS — Z0001 Encounter for general adult medical examination with abnormal findings: Secondary | ICD-10-CM | POA: Diagnosis not present

## 2020-04-14 DIAGNOSIS — E782 Mixed hyperlipidemia: Secondary | ICD-10-CM | POA: Diagnosis not present

## 2020-04-14 DIAGNOSIS — M48061 Spinal stenosis, lumbar region without neurogenic claudication: Secondary | ICD-10-CM | POA: Diagnosis not present

## 2020-04-25 DIAGNOSIS — C61 Malignant neoplasm of prostate: Secondary | ICD-10-CM

## 2020-04-25 HISTORY — DX: Malignant neoplasm of prostate: C61

## 2020-05-13 LAB — COLOGUARD: COLOGUARD: NEGATIVE

## 2020-08-26 ENCOUNTER — Telehealth: Payer: Self-pay

## 2020-08-26 NOTE — Telephone Encounter (Signed)
This patient was wondering if he needs to get his PSA checked before his appt in July.  Wife said it was high.  Please advise.  Thanks, Helene Kelp

## 2020-08-27 NOTE — Telephone Encounter (Signed)
Would you like PSA? I didn't see that in your last note. Patient wife is asking.

## 2020-09-01 ENCOUNTER — Other Ambulatory Visit: Payer: Self-pay | Admitting: Urology

## 2020-09-01 ENCOUNTER — Ambulatory Visit: Payer: BC Managed Care – PPO | Admitting: Urology

## 2020-09-01 ENCOUNTER — Other Ambulatory Visit: Payer: BC Managed Care – PPO | Admitting: Urology

## 2020-09-01 DIAGNOSIS — R972 Elevated prostate specific antigen [PSA]: Secondary | ICD-10-CM

## 2020-09-01 NOTE — Telephone Encounter (Signed)
Left message for patient if he calls back.  PSA order in and lab appt made.

## 2020-10-27 ENCOUNTER — Other Ambulatory Visit: Payer: Self-pay

## 2020-10-27 ENCOUNTER — Other Ambulatory Visit: Payer: BC Managed Care – PPO

## 2020-10-27 DIAGNOSIS — R972 Elevated prostate specific antigen [PSA]: Secondary | ICD-10-CM

## 2020-10-28 LAB — PSA: Prostate Specific Ag, Serum: 12.7 ng/mL — ABNORMAL HIGH (ref 0.0–4.0)

## 2020-11-02 NOTE — Progress Notes (Signed)
History of Present Illness:   Originally sent by Dr. Pleas Koch for evaluation and management/follow-up of bladder cancer. He underwent initial TURBT on 3,.15.2016. 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 Muscle was not present in the initial resection. He was given mitomycin after the initial resection. Repeat resection on 4.25.2016 revealed reactive appearing cells, with a papilloma and no evidence of recurrence.   5.4.2021--cysto negative.  7.12.2022: He has had no issues with urinary tract infection or hematuria. IPSS 7, quality-of-life score 1.  Not on any medical therapy for prostate.  Elevated PSA--  PSA was 5.9 in December 2019. PSA 6.7 in May 2021 PSA 12.7 recently.    Renal Cyst:   The problem is on bot Prior to his TURBT, he had contrasted CT scan revealing bilateral renal cysts with a hyperdense cyst in the upper pole.  Past Medical History:  Diagnosis Date   Arthritis    Bladder cancer ([place])    Depression    GERD (gastroesophageal reflux disease)    Hypercholesteremia    No pertinent past medical history    Renal cyst     Past Surgical History:  Procedure Laterality Date   ANKLE ARTHRODESIS     age 50-bone frag,cyst-rt   BLADDER SURGERY     COLONOSCOPY     ORIF FINGER FRACTURE  06/15/2011   Procedure: OPEN REDUCTION INTERNAL FIXATION (ORIF) METACARPAL (FINGER) FRACTURE;  Surgeon: Wynonia Sours, MD;  Location: [facility];  Service: Orthopedics;  Laterality: Right;  Proximal phalanx right little finger   RETINAL DETACHMENT SURGERY      Home Medications:  Allergies as of 11/03/2020       Reactions   Nsaids Anaphylaxis        Medication List        Accurate as of November 02, 2020  7:03 PM. If you have any questions, ask your nurse or doctor.          acetaminophen 325 MG tablet Commonly known as: TYLENOL Take 650 mg by mouth every 6 (six) hours as needed.   atorvastatin 40 MG  tablet Commonly known as: LIPITOR Take 40 mg by mouth daily.   multivitamin capsule Take 1 capsule by mouth daily.   sertraline 100 MG tablet Commonly known as: ZOLOFT Take 100 mg by mouth daily.        Allergies:  Allergies  Allergen Reactions   Nsaids Anaphylaxis    Family History  Problem Relation Age of Onset   Heart disease Father    Lung cancer Other     Social History:  reports that he quit smoking about 44 years ago. He has never used smokeless tobacco. He reports that he does not drink alcohol and does not use drugs.  ROS: A complete review of systems was performed.  All systems are negative except for pertinent findings as noted.  Physical Exam:  Vital signs in last 24 hours: There were no vitals taken for this visit. Constitutional:  Alert and oriented, No acute distress Cardiovascular: Regular rate  Respiratory: Normal respiratory effort Genitourinary: Normal male phallus, testes are descended bilaterally and non-tender and without masses, scrotum is normal in appearance without lesions or masses, perineum is normal on inspection.  Prostate 70 mL, symmetric, nonnodular, nontender. Lymphatic: No lymphadenopathy Neurologic: Grossly intact, no focal deficits Psychiatric: Normal mood and affect  I have reviewed prior pt notes  I have reviewed notes from referring/previous physicians  I have reviewed urinalysis results  I have  reviewed prior PSA results  Cystoscopy Procedure Note:  Indication: Bladder cancer followup  After informed consent and discussion of the procedure and its risks, Roger Vega was positioned and prepped in the standard fashion.  Cystoscopy was performed with a flexible cystoscope.   Findings: Urethra: No stricture or lesion Prostate: Minimal obstruction, bilobar hypertrophy Bladder neck: Normal Ureteral orifices: Normal bilaterally Bladder: No urothelial abnormalities, no stones  The patient tolerated the procedure  well.      Impression/Assessment:  1.  History of bladder cancer, negative cystoscopy today  2.  Elevating PSA trend, benign DRE  Plan:  1.  I recommended that we proceed with ultrasound and biopsy of the prostate.  I discussed this with the patient as well as risks and patient.  He desires to proceed  2.  Cystoscopy covered with Cipro today

## 2020-11-03 ENCOUNTER — Ambulatory Visit: Payer: BC Managed Care – PPO | Admitting: Urology

## 2020-11-03 ENCOUNTER — Other Ambulatory Visit: Payer: Self-pay

## 2020-11-03 ENCOUNTER — Encounter: Payer: Self-pay | Admitting: Urology

## 2020-11-03 ENCOUNTER — Other Ambulatory Visit: Payer: BC Managed Care – PPO | Admitting: Urology

## 2020-11-03 VITALS — BP 157/90 | HR 81 | Temp 98.2°F | Wt 180.2 lb

## 2020-11-03 DIAGNOSIS — Z8551 Personal history of malignant neoplasm of bladder: Secondary | ICD-10-CM

## 2020-11-03 DIAGNOSIS — R972 Elevated prostate specific antigen [PSA]: Secondary | ICD-10-CM | POA: Diagnosis not present

## 2020-11-03 LAB — URINALYSIS, ROUTINE W REFLEX MICROSCOPIC
Bilirubin, UA: NEGATIVE
Glucose, UA: NEGATIVE
Ketones, UA: NEGATIVE
Leukocytes,UA: NEGATIVE
Nitrite, UA: NEGATIVE
Protein,UA: NEGATIVE
RBC, UA: NEGATIVE
Specific Gravity, UA: <1.005 — ABNORMAL LOW (ref 1.005–1.030)
Urobilinogen, Ur: 0.2 mg/dL (ref 0.2–1.0)
pH, UA: 5.5 (ref 5.0–7.5)

## 2020-11-03 MED ORDER — LEVOFLOXACIN 750 MG PO TABS: 750.0000 mg | ORAL_TABLET | Freq: Every day | ORAL | 0 refills | Status: AC

## 2020-11-03 MED ORDER — CIPROFLOXACIN HCL 500 MG PO TABS
500.0000 mg | ORAL_TABLET | Freq: Once | ORAL | Status: AC
  Administered 2020-11-03: 500 mg via ORAL

## 2020-11-03 NOTE — Progress Notes (Signed)

## 2020-11-03 NOTE — Patient Instructions (Addendum)
   Appointment Time: Arrive 8am Appointment Date: 11/24/2020  Location: [facility]   Prostate Biopsy Instructions  Stop all aspirin or blood thinners (aspirin, plavix, coumadin, warfarin, motrin, ibuprofen, advil, aleve, naproxen, naprosyn) for 7 days prior to the procedure.  If you have any questions about stopping these medications, please contact your primary care physician or cardiologist.  Having a light meal prior to the procedure is recommended.  If you are diabetic or have low blood sugar please bring a small snack or glucose tablet.  A Fleets enema is needed to be purchased over the counter at a local pharmacy and used 2 hours before you scheduled appointment.  This can be purchased over the counter at any pharmacy.  Antibiotics will be administered in the clinic at the time of the procedure and 1 tablet has been sent to your pharmacy. Please take the antibiotic as prescribed.    Please bring someone with you to the procedure to drive you home if you are given a valium to take prior to your procedure.   If you have any questions or concerns, please feel free to call the office at [phone] or send a Mychart message.    Thank you, [facility]

## 2020-11-03 NOTE — Addendum Note (Signed)
Addended byIris Pert on: 11/03/2020 01:54 PM   Modules accepted: Orders

## 2020-11-23 NOTE — Progress Notes (Signed)
This man is here today for ultrasound and biopsy of his prostate.  He has an elevated PSA.  Risks, benefits, and some of the potential complications of a transrectal ultrasounds of the prostate (TRUSP) with biopsies were discussed at length with the patient including gross hematuria, blood in the bowel movements, hematospermia, bacteremia, infection, voiding discomfort, urinary retention, fever, chills, sepsis, blood transfusion, death, and others. All questions were answered. Informed consent was obtained. The patient confirmed that he had taken his pre-procedure antibiotic. All anticoagulants were discontinued prior to the procedure. The patient emptied his bladder. He was positioned in a comfortable left lateral decubitus position with hips and knees acutely flexed.  The rectal probe was inserted into the rectum without difficulty. 10cc of 2% Lidocaine without epinephrine was instilled with a spinal needle using ultrasound guidance near the junction of each seminal vesicle and the prostate.  Sequential transverse (axial) scans were made in small increments beginning at the seminal vesicles and ending at the prostatic apex. Sequential longitudinal (saggital) scans were made in small increments beginning at the right lateral prostate and ending at the left lateral prostate. Excellent anatomical imaging was obtained. The peripheral, transitional, and central zones were well-defined. The seminal vesicles were normal.  Prostate volume 36 ml.  There were no hypoechoic areas. 12 needle core biopsies were performed. 1 biopsy each was taken from the following areas:  Right lateral base, right medial base, right lateral mid prostate, right medial mid prostate, right lateral apical prostate, right medial apical prostate, left lateral base, left medial base, left lateral mid prostate, left medial mid prostate, left lateral apical prostate, left medial apical prostate.. Minimal prostatic calcifications were noted.  Excellent biopsy specimens were obtained.  Follow-up rectal examination was unremarkable. The procedure was well-tolerated and without complications. Antibiotic instructions were given. The patient was told that:  For several days:  he should increase his fluid intake and limit strenuous activity  he might have mild discomfort at the base of his penis or in his rectum  he might have blood in his urine or blood in his bowel movements  For 2-3 months:  he might have blood in his ejaculate (semen)  Instructions were given to call the office immedicately for blood clots in the urine or bowel movements, difficulty urinating, inability to urinate, urinary retention, painful or frequent urination, fever, chills, nausea, vomiting, or other illness. The patient stated that he understood these instructions and would comply with them. We told the patient that prostate biopsy pathology reports are usually available within 3-5 working days, unless a pathologic second opinion is required, which may take 7-14 days. We told him to contact us to check on the status of his biopsy if he has not heard from [place] within 7 days. The patient left the ultrasound examination room in stable condition.

## 2020-11-24 ENCOUNTER — Ambulatory Visit (HOSPITAL_COMMUNITY)
Admission: RE | Admit: 2020-11-24 | Discharge: 2020-11-24 | Disposition: A | Discharge status: Home / Self Care | Payer: BC Managed Care – PPO | Source: Ambulatory Visit | Attending: Urology | Admitting: Urology

## 2020-11-24 ENCOUNTER — Other Ambulatory Visit: Payer: Self-pay

## 2020-11-24 ENCOUNTER — Other Ambulatory Visit: Payer: Self-pay | Admitting: Urology

## 2020-11-24 ENCOUNTER — Ambulatory Visit (HOSPITAL_BASED_OUTPATIENT_CLINIC_OR_DEPARTMENT_OTHER): Payer: BC Managed Care – PPO | Admitting: Urology

## 2020-11-24 ENCOUNTER — Encounter (HOSPITAL_COMMUNITY): Payer: Self-pay

## 2020-11-24 DIAGNOSIS — R972 Elevated prostate specific antigen [PSA]: Secondary | ICD-10-CM | POA: Diagnosis not present

## 2020-11-24 DIAGNOSIS — C61 Malignant neoplasm of prostate: Secondary | ICD-10-CM | POA: Insufficient documentation

## 2020-11-24 MED ORDER — GENTAMICIN SULFATE 40 MG/ML IJ SOLN: 160.0000 mg | Freq: Once | INTRAMUSCULAR | Status: AC

## 2020-11-24 MED ORDER — LIDOCAINE HCL (PF) 2 % IJ SOLN: 10.0000 mL | Freq: Once | INTRAMUSCULAR | Status: AC

## 2020-11-24 MED ORDER — LIDOCAINE HCL (PF) 2 % IJ SOLN
INTRAMUSCULAR | Status: AC
  Administered 2020-11-24: 10 mL
  Filled 2020-11-24: qty 10

## 2020-11-24 MED ORDER — GENTAMICIN SULFATE 40 MG/ML IJ SOLN
INTRAMUSCULAR | Status: AC
  Administered 2020-11-24: 160 mg via INTRAMUSCULAR
  Filled 2020-11-24: qty 4

## 2020-11-24 NOTE — Sedation Documentation (Signed)
PT tolerated prostate biopsy procedure and antibiotic injections well today. Labs obtained and sent for pathology. PT ambulatory at discharge with no acute distress noted and verbalized understanding of discharge instructions.  

## 2020-11-27 ENCOUNTER — Telehealth: Payer: Self-pay

## 2020-11-27 NOTE — Telephone Encounter (Signed)
Patient called requesting call back concerning biopsy results.

## 2020-12-01 NOTE — Telephone Encounter (Signed)
Patient called again requesting you call wife to discuss biopsy results.

## 2020-12-02 ENCOUNTER — Telehealth: Payer: Self-pay

## 2020-12-02 NOTE — Telephone Encounter (Signed)
Open in error

## 2020-12-02 NOTE — Telephone Encounter (Signed)
Patient's wife calling back to check the plan and to discuss results from tests/ biopsy recently done.  Call back:  [phone]   Thanks, Helene Kelp

## 2020-12-02 NOTE — Telephone Encounter (Signed)
Wife called office again today concerning recent prostate biopsy results.  Wife expressed concern that she has not heard from MD regarding results as per wife she stated she was informed she would receive a call last Friday. Wife reported they had viewed pathology report on patient mychart and feel results are urgent.  Reviewed with wife results had been sent to MD for review and he normally calls his patients with results when in clinic and that he does have OR days during the week as well. Wife voiced understanding of MD schedule.  I informed wife I would send another message. Wife declined and stated they would like to switch providers. Wife requested Dr. Alyson Ingles for discussion of prostate biopsy results in office. Dr. Alyson Ingles stated he would see patient but a message should be relayed to Dr. Diona Fanti of patient requesting results again. Dr. Diona Fanti made aware of patient complaint and he would call patient this afternoon to discuss.

## 2020-12-02 NOTE — Telephone Encounter (Signed)
Patient wife is calling about prostate biopsy results.

## 2020-12-04 ENCOUNTER — Ambulatory Visit: Payer: BC Managed Care – PPO | Admitting: Urology

## 2020-12-04 ENCOUNTER — Other Ambulatory Visit: Payer: Self-pay

## 2020-12-04 ENCOUNTER — Encounter: Payer: Self-pay | Admitting: Urology

## 2020-12-04 VITALS — BP 146/82 | HR 66

## 2020-12-04 DIAGNOSIS — C61 Malignant neoplasm of prostate: Secondary | ICD-10-CM

## 2020-12-04 NOTE — Progress Notes (Signed)
12/04/2020 3:28 PM   Roger Vega 18-Jul-1955 [id number]  Referring provider: Curlene Labrum, MD [place],  [place] [address]  Followup after prostate biopsy   HPI: Roger Vega is a 65yo here for followup after prostate biopsy. Biopsy revealed Gleason 4+3=7 in 3/12 cores and Gleason 3+3=6 in 3/12 cores. PSA 12.7. He denies significant LUTS. He has mild erectile dysfunction   PMH: Past Medical History:  Diagnosis Date   Arthritis    Bladder cancer ([place])    Depression    GERD (gastroesophageal reflux disease)    Hypercholesteremia    No pertinent past medical history    Renal cyst     Surgical History: Past Surgical History:  Procedure Laterality Date   ANKLE ARTHRODESIS     age 5-bone frag,cyst-rt   BLADDER SURGERY     COLONOSCOPY     ORIF FINGER FRACTURE  06/15/2011   Procedure: OPEN REDUCTION INTERNAL FIXATION (ORIF) METACARPAL (FINGER) FRACTURE;  Surgeon: Wynonia Sours, MD;  Location: [facility];  Service: Orthopedics;  Laterality: Right;  Proximal phalanx right little finger   RETINAL DETACHMENT SURGERY      Home Medications:  Allergies as of 12/04/2020       Reactions   Nsaids Anaphylaxis        Medication List        Accurate as of December 04, 2020  3:28 PM. If you have any questions, ask your nurse or doctor.          acetaminophen 325 MG tablet Commonly known as: TYLENOL Take 650 mg by mouth every 6 (six) hours as needed.   atorvastatin 40 MG tablet Commonly known as: LIPITOR Take 40 mg by mouth daily.   multivitamin capsule Take 1 capsule by mouth daily.   sertraline 100 MG tablet Commonly known as: ZOLOFT Take 100 mg by mouth daily.        Allergies:  Allergies  Allergen Reactions   Nsaids Anaphylaxis    Family History: Family History  Problem Relation Age of Onset   Heart disease Father    Lung cancer Other     Social History:  reports that he quit smoking about 44 years ago. His smoking use  included cigarettes. He has never used smokeless tobacco. He reports that he does not drink alcohol and does not use drugs.  ROS: All other review of systems were reviewed and are negative except what is noted above in HPI  Physical Exam: There were no vitals taken for this visit.  Constitutional:  Alert and oriented, No acute distress. HEENT: [place] AT, moist mucus membranes.  Trachea midline, no masses. Cardiovascular: No clubbing, cyanosis, or edema. Respiratory: Normal respiratory effort, no increased work of breathing. GI: Abdomen is soft, nontender, nondistended, no abdominal masses GU: No CVA tenderness.  Lymph: No cervical or inguinal lymphadenopathy. Skin: No rashes, bruises or suspicious lesions. Neurologic: Grossly intact, no focal deficits, moving all 4 extremities. Psychiatric: Normal mood and affect.  Laboratory Data: Lab Results  Component Value Date   HGB 15.6 06/15/2011    No results found for: CREATININE  Lab Results  Component Value Date   PSA 6.7 (H) 08/27/2019    No results found for: TESTOSTERONE  No results found for: HGBA1C  Urinalysis    Component Value Date/Time   APPEARANCEUR Clear 11/03/2020 1056   GLUCOSEU Negative 11/03/2020 1056   BILIRUBINUR Negative 11/03/2020 1056   PROTEINUR Negative 11/03/2020 1056   UROBILINOGEN negative (A) 08/27/2019  1102   NITRITE Negative 11/03/2020 1056   LEUKOCYTESUR Negative 11/03/2020 1056    Lab Results  Component Value Date   LABMICR Comment 11/03/2020    Pertinent Imaging:  No results found for this or any previous visit.  No results found for this or any previous visit.  No results found for this or any previous visit.  No results found for this or any previous visit.  No results found for this or any previous visit.  No results found for this or any previous visit.  No results found for this or any previous visit.  No results found for this or any previous visit.   Assessment & Plan:     1. Prostate cancer [facility]) I discussed the natural history of low/intermediate/high risk prostate cancer with the patient and the various treatment options including active surveillance, RALP, IMRT, brachytherapy, cryotherapy, HIFU and ADT. We will obtain CT and bone scan. I will have him see Dr. Tammi Klippel for consideration of radiation therapy. I will also have him see Dr. Bethel Born for consideration of RALP.  No follow-ups on file.  Nicolette Bang, MD  [facility] [place]

## 2020-12-04 NOTE — Patient Instructions (Signed)
Prostate Cancer  The prostate is a small gland (1.5 inches [3.8 cm] wide and 1 inch [2.5 cm] high) that is involved in the production of semen. It is located below a man's bladder, in front of the rectum. Prostate cancer is the abnormal growth ofcells in the prostate gland. What are the causes? The exact cause of this condition is not known. What increases the risk? You are more likely to develop this condition if: You are 63 years of age or older. You are African American. You have a family history of prostate cancer. You have a family history of breast cancer. What are the signs or symptoms? Symptoms of this condition include: A need to urinate often. Weak or interrupted flow of urine. Trouble starting or stopping urination. Inability to urinate. Blood in urine or semen. Persistent pain or discomfort in the lower back, lower abdomen, hips, or upper thighs. Trouble getting an erection. Trouble emptying the bladder all the way. How is this diagnosed? This condition can be diagnosed with: A digital rectal exam. For this exam, a health care provider inserts a gloved finger into the rectum to feel the prostate gland. A blood test called a prostate-specific antigen (PSA) test. A procedure in which a sample of tissue is taken from the prostate and checked under a microscope (prostate biopsy). An imaging test called transrectal ultrasonography. Once the condition is diagnosed, tests will be done to determine how far the cancer has spread. This is called staging the cancer. Staging may involve imaging tests, such as: A bone scan. A CT scan. A PET scan. An MRI. The stages of prostate cancer are as follows: Stage I. At this stage, the cancer is found in the prostate only. The cancer is not visible on imaging tests, and it is usually found by accident, such as during prostate surgery. Stage II. At this stage, the cancer is more advanced than it is in stage I, but the cancer has not spread  outside the prostate. Stage III. At this stage, the cancer has spread beyond the outer layer of the prostate to nearby tissues. The cancer may be found in the seminal vesicles, which are near the bladder and the prostate. Stage IV. At this stage, the cancer has spread to other parts of the body, such as the lymph nodes, bones, bladder, rectum, liver, or lungs. How is this treated? Treatment for this condition depends on several factors, including the stage of the cancer, your age, personal preferences, and your overall health. Talk with your health care provider about treatment options that are recommended for you. Common treatments include: Observation for early stage prostate cancer (active surveillance). This involves having exams, blood tests, and in some cases, more biopsies. For some men, this is the only treatment needed. Surgery. Types of surgeries include: Open surgery (prostatectomy). In this surgery, a larger incision is made to remove the prostate. A laparoscopic prostatectomy. This is a surgery to remove the prostate and lymph nodes through several, small incisions. It is often referred to as a minimally invasive surgery. A robotic prostatectomy. This is laparoscopic surgery to remove the prostate and lymph nodes with the help of robotic arms that are controlled by the surgeon. Orchiectomy. This is surgery to remove the testicles. Cryosurgery. This is surgery to freeze and destroy cancer cells. Radiation treatment. Types of radiation treatment include: External beam radiation. This type aims beams of radiation from outside the body at the prostate to destroy cancerous cells. Brachytherapy. This type uses radioactive needles,   seeds, wires, or tubes that are implanted into the prostate gland. Like external beam radiation, brachytherapy destroys cancerous cells. An advantage is that this type of radiation limits the damage to surrounding tissue and has fewer side effects. High-intensity,  focused ultrasonography. This treatment destroys cancer cells by delivering high-energy ultrasound waves to the cancerous cells. Chemotherapy medicines. This treatment kills cancer cells or stops them from multiplying. It kills both cancer cells and normal cells. Targeted therapy. This treatment uses medicines to kill cancer cells without damaging normal cells. Hormone treatment. This treatment involves taking medicines that act on one of the male hormones (testosterone): By stopping your body from producing testosterone. By blocking testosterone from reaching cancer cells. Follow these instructions at home: Take over-the-counter and prescription medicines only as told by your health care provider. Maintain a healthy diet. Get plenty of sleep. Consider joining a support group for men who have prostate cancer. Meeting with a support group may help you learn to manage the stress of having cancer. If you have to go to the hospital, notify your cancer specialist (oncologist). Treatment for prostate cancer may affect sexual function. Continue to have intimate moments with your partner. This may include touching, holding, hugging, and caressing. Keep all follow-up visits as told by your health care provider. This is important. Contact a health care provider if: You have new or increasing trouble urinating. You have new or increasing blood in your urine. You have new or increasing pain in your hips, back, or chest. Get help right away if: You have weakness or numbness in your legs. You cannot control urination or your bowel movements (incontinence). You have chills or a fever. Summary The prostate is a small gland that is involved in the production of semen. It is located below a man's bladder, in front of the rectum. Prostate cancer is the abnormal growth of cells in the prostate gland. Treatment for this condition depends on the stage of the cancer, your age, personal preferences, and your  overall health. Talk with your health care provider about treatment options that are recommended for you. Consider joining a support group for men who have prostate cancer. Meeting with a support group may help you learn to cope with the stress of having cancer. This information is not intended to replace advice given to you by your health care provider. Make sure you discuss any questions you have with your healthcare provider. Document Revised: 03/26/2019 Document Reviewed: 03/26/2019 Elsevier Patient Education  2022 [employer]vier Inc.  

## 2020-12-07 ENCOUNTER — Other Ambulatory Visit: Payer: BC Managed Care – PPO

## 2020-12-07 ENCOUNTER — Other Ambulatory Visit: Payer: Self-pay

## 2020-12-08 LAB — BASIC METABOLIC PANEL
BUN/Creatinine Ratio: 18 (ref 10–24)
BUN: 16 mg/dL (ref 8–27)
CO2: 24 mmol/L (ref 20–29)
Calcium: 9 mg/dL (ref 8.6–10.2)
Chloride: 105 mmol/L (ref 96–106)
Creatinine, Ser: 0.91 mg/dL (ref 0.76–1.27)
Glucose: 93 mg/dL (ref 65–99)
Potassium: 4.7 mmol/L (ref 3.5–5.2)
Sodium: 141 mmol/L (ref 134–144)
eGFR: 95 mL/min/{1.73_m2} (ref >59)

## 2020-12-15 ENCOUNTER — Other Ambulatory Visit (HOSPITAL_COMMUNITY): Payer: BC Managed Care – PPO

## 2020-12-16 ENCOUNTER — Other Ambulatory Visit: Payer: Self-pay

## 2020-12-16 ENCOUNTER — Encounter (HOSPITAL_COMMUNITY)
Admission: RE | Admit: 2020-12-16 | Discharge: 2020-12-16 | Disposition: A | Discharge status: Home / Self Care | Payer: BC Managed Care – PPO | Source: Ambulatory Visit | Attending: Urology | Admitting: Urology

## 2020-12-16 DIAGNOSIS — C61 Malignant neoplasm of prostate: Secondary | ICD-10-CM | POA: Diagnosis present

## 2020-12-16 MED ORDER — TECHNETIUM TC 99M MEDRONATE IV KIT
20.0000 | PACK | Freq: Once | INTRAVENOUS | Status: AC | PRN
  Administered 2020-12-16: 22.4 via INTRAVENOUS

## 2020-12-22 NOTE — Progress Notes (Signed)
Sent via mychart

## 2021-01-11 ENCOUNTER — Other Ambulatory Visit (HOSPITAL_COMMUNITY): Payer: BC Managed Care – PPO

## 2021-01-11 ENCOUNTER — Ambulatory Visit (HOSPITAL_COMMUNITY)
Admission: RE | Admit: 2021-01-11 | Discharge: 2021-01-11 | Disposition: A | Discharge status: Home / Self Care | Payer: BC Managed Care – PPO | Source: Ambulatory Visit | Attending: Urology | Admitting: Urology

## 2021-01-11 ENCOUNTER — Encounter (HOSPITAL_COMMUNITY): Payer: Self-pay | Admitting: Radiology

## 2021-01-11 ENCOUNTER — Other Ambulatory Visit: Payer: Self-pay

## 2021-01-11 DIAGNOSIS — C61 Malignant neoplasm of prostate: Secondary | ICD-10-CM | POA: Diagnosis present

## 2021-01-11 LAB — POCT I-STAT CREATININE: Creatinine, Ser: 0.9 mg/dL (ref 0.61–1.24)

## 2021-01-11 MED ORDER — IOHEXOL 350 MG/ML SOLN
100.0000 mL | Freq: Once | INTRAVENOUS | Status: AC | PRN
  Administered 2021-01-11: 80 mL via INTRAVENOUS

## 2021-01-18 ENCOUNTER — Encounter: Payer: Self-pay | Admitting: Radiation Oncology

## 2021-01-18 ENCOUNTER — Ambulatory Visit
Admission: RE | Admit: 2021-01-18 | Discharge: 2021-01-18 | Disposition: A | Discharge status: Home / Self Care | Payer: BC Managed Care – PPO | Source: Ambulatory Visit | Attending: Radiation Oncology | Admitting: Radiation Oncology

## 2021-01-18 ENCOUNTER — Telehealth: Payer: Self-pay | Admitting: Radiation Oncology

## 2021-01-18 ENCOUNTER — Ambulatory Visit: Payer: BC Managed Care – PPO

## 2021-01-18 ENCOUNTER — Ambulatory Visit (HOSPITAL_COMMUNITY): Payer: BC Managed Care – PPO

## 2021-01-18 ENCOUNTER — Ambulatory Visit: Payer: BC Managed Care – PPO | Admitting: Radiation Oncology

## 2021-01-18 DIAGNOSIS — C61 Malignant neoplasm of prostate: Secondary | ICD-10-CM

## 2021-01-18 NOTE — Progress Notes (Addendum)
GU Location of Tumor / Histology: Prostate Ca  If Prostate Cancer, Gleason Score is (4 + 3) and PSA is (12.7 as of 12/04/2020)  Biopsies :   Past/Anticipated interventions by urology, if any:   Dr. Diona Fanti: Sequential transverse (axial) scans were made in small increments beginning at the seminal vesicles and ending at the prostatic apex. Sequential longitudinal (saggital) scans were made in small increments beginning at the right lateral prostate and ending at the left lateral prostate. Excellent anatomical imaging was obtained. The peripheral, transitional, and central zones were well-defined. The seminal vesicles were normal.   Prostate volume 36 ml.   There were no hypoechoic areas. 12 needle core biopsies were performed. 1 biopsy each was taken from the following areas:   Right lateral base, right medial base, right lateral mid prostate, right medial mid prostate, right lateral apical prostate, right medial apical prostate, left lateral base, left medial base, left lateral mid prostate, left medial mid prostate, left lateral apical prostate, left medial apical prostate.. Minimal prostatic calcifications were noted. Excellent biopsy specimens were obtained.  Past/Anticipated interventions by medical oncology, if any:   Weight changes, if any: No SHIM: 20 IPSS:  1 Bowel/Bladder complaints, if any:  No  Nausea/Vomiting, if any: No  Pain issues, if any:  0/10  SAFETY ISSUES: Prior radiation? No Pacemaker/ICD? No Possible current pregnancy? Male Is the patient on methotrexate? No  Current Complaints / other details:  None at this time.

## 2021-01-18 NOTE — Progress Notes (Signed)
Radiation Oncology         [phone] ________________________________  Initial Outpatient Consultation  Name: Roger Vega MRN: [id number]  Date: 01/18/2021  DOB: 1955/09/18  FK:CLEXNTZ, Virgina Evener, MD  McKenzie, Candee Furbish, MD   REFERRING PHYSICIAN: Cleon Gustin, MD  DIAGNOSIS: 65 y.o. gentleman with Stage T1c adenocarcinoma of the prostate with Gleason Score of 4+3, and PSA of 12.7.    ICD-10-CM   1. Prostate cancer ([place])  C61       HISTORY OF PRESENT ILLNESS: Roger Vega is a 65 y.o. man who was originally referred to [facility] by Dr. Pleas Koch for evaluation and management/follow-up of bladder cancer. He underwent initial TURBT on 07/08/2014. 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 Muscle was not present in the initial resection. He was given mitomycin after the initial resection. Repeat resection on 08/18/2014 revealed reactive appearing cells, with a papilloma and no evidence of recurrence. On 08/27/2019--cysto was negative.  Over time, he has been noted to have an elevated PSA-- it was 5.9 in December 2019, PSA 6.7 was in May 2021 and PSA 12.7 recently.  He was seen in urology by Dr. Diona Fanti on 11/03/20,  digital rectal examination was performed at that time revealing no nodules.  The patient proceeded to transrectal ultrasound with 12 biopsies of the prostate on 11/24/20.  The prostate volume measured 36 cc.  Out of 12 core biopsies, 6 were positive.  The maximum Gleason score was 3+4, and this was seen in the distribution noted below.    Bone scan 12/16/20 and CT abdomen/pelvis were done on 01/11/21 both showing no evidence of metastatic disease.  The patient reviewed the biopsy results with his urologist and he has kindly been referred today for discussion of potential radiation treatment options.   PREVIOUS RADIATION THERAPY: No  PAST MEDICAL HISTORY:  Past Medical History:  Diagnosis Date   Arthritis     Bladder cancer ([place])    Depression    GERD (gastroesophageal reflux disease)    Hypercholesteremia    No pertinent past medical history    Renal cyst       PAST SURGICAL HISTORY: Past Surgical History:  Procedure Laterality Date   ANKLE ARTHRODESIS     age 60-bone frag,cyst-rt   BLADDER SURGERY     COLONOSCOPY     ORIF FINGER FRACTURE  06/15/2011   Procedure: OPEN REDUCTION INTERNAL FIXATION (ORIF) METACARPAL (FINGER) FRACTURE;  Surgeon: Wynonia Sours, MD;  Location: [facility];  Service: Orthopedics;  Laterality: Right;  Proximal phalanx right little finger   RETINAL DETACHMENT SURGERY      FAMILY HISTORY:  Family History  Problem Relation Age of Onset   Heart disease Father    Lung cancer Other     SOCIAL HISTORY:  Social History   Socioeconomic History   Marital status: Married    Spouse name: Not on file   Number of children: Not on file   Years of education: Not on file   Highest education level: Not on file  Occupational History   Not on file  Tobacco Use   Smoking status: Former    Types: Cigarettes    Quit date: 06/13/1976    Years since quitting: 44.6   Smokeless tobacco: Never  Substance and Sexual Activity   Alcohol use: No   Drug use: No   Sexual activity: Not on file  Other Topics Concern   Not on file  Social History Narrative  Not on file   Social Determinants of Health   Financial Resource Strain: Not on file  Food Insecurity: Not on file  Transportation Needs: Not on file  Physical Activity: Not on file  Stress: Not on file  Social Connections: Not on file  Intimate Partner Violence: Not on file    ALLERGIES: Nsaids  MEDICATIONS:  Current Outpatient Medications  Medication Sig Dispense Refill   acetaminophen (TYLENOL) 325 MG tablet Take 650 mg by mouth every 6 (six) hours as needed.     atorvastatin (LIPITOR) 40 MG tablet Take 40 mg by mouth daily.     sertraline (ZOLOFT) 100 MG tablet Take 100 mg by mouth daily.      Multiple Vitamin (MULTIVITAMIN) capsule Take 1 capsule by mouth daily.     No current facility-administered medications for this encounter.    REVIEW OF SYSTEMS:  On review of systems, the patient reports that he is doing well overall. He denies any chest pain, shortness of breath, cough, fevers, chills, night sweats, unintended weight changes. He denies any bowel disturbances, and denies abdominal pain, nausea or vomiting. He denies any new musculoskeletal or joint aches or pains. His IPSS was Total Score: 1, indicating mild urinary symptoms. His SHIM: 20, indicating he does not have severe erectile dysfunction. A complete review of systems is obtained and is otherwise negative.   PHYSICAL EXAM:  Wt Readings from Last 3 Encounters:  11/03/20 180 lb 3.2 oz (81.7 kg)  08/27/19 180 lb (81.6 kg)  06/14/11 170 lb (77.1 kg)   Temp Readings from Last 3 Encounters:  11/24/20 98.1 F (36.7 C) (Oral)  11/03/20 98.2 F (36.8 C)  08/27/19 (!) 97.5 F (36.4 C)   BP Readings from Last 3 Encounters:  12/04/20 (!) 146/82  11/24/20 (!) 144/90  11/03/20 (!) 157/90   Pulse Readings from Last 3 Encounters:  12/04/20 66  11/24/20 70  11/03/20 81   Pain Assessment Pain Score: 0-No pain/10  In general this is a well appearing gentleman in no acute distress. He's alert and oriented x4 and appropriate throughout the examination. Cardiopulmonary assessment is negative for acute distress, and he exhibits normal effort.    KPS = 100  100 - Normal; no complaints; no evidence of disease. 90   - Able to carry on normal activity; minor signs or symptoms of disease. 80   - Normal activity with effort; some signs or symptoms of disease. 36   - Cares for self; unable to carry on normal activity or to do active work. 60   - Requires occasional assistance, but is able to care for most of his personal needs. 50   - Requires considerable assistance and frequent medical care. 28   - Disabled; requires  special care and assistance. 72   - Severely disabled; hospital admission is indicated although death not imminent. 56   - Very sick; hospital admission necessary; active supportive treatment necessary. 10   - Moribund; fatal processes progressing rapidly. 0     - Dead  Karnofsky DA, Abelmann Shokan, Craver LS and Burchenal [facility] [phone]) The use of the nitrogen mustards in the palliative treatment of carcinoma: with particular reference to bronchogenic carcinoma Cancer 1 634-56  LABORATORY DATA:  Lab Results  Component Value Date   HGB 15.6 06/15/2011   Lab Results  Component Value Date   NA 141 12/07/2020   K 4.7 12/07/2020   CL 105 12/07/2020   CO2 24 12/07/2020   No results found for:  ALT, AST, GGT, ALKPHOS, BILITOT   RADIOGRAPHY: CT Abdomen Pelvis W Wo Contrast  Result Date: 01/12/2021 CLINICAL DATA:  New diagnosis of prostate cancer. History of bladder cancer with surgery and chemotherapy in 2016. EXAM: CT ABDOMEN AND PELVIS WITHOUT AND WITH CONTRAST TECHNIQUE: Multidetector CT imaging of the abdomen and pelvis was performed following the standard protocol before and following the bolus administration of intravenous contrast. CONTRAST:  59mL OMNIPAQUE IOHEXOL 350 MG/ML SOLN COMPARISON:  Bone scan 12/17/2020. Report of an abdominopelvic CT from [facility] [place] 05/27/2014. FINDINGS: Lower chest: A 5 mm anterior right lung base nodule on 01/06 may correspond to a 5 mm nodule described on the prior abdominal CT. A subpleural right lower lobe 8 mm nodule on 05/06. Calcified left lower lobe granuloma has been detailed previously. 3 mm left lower lobe pulmonary nodule on 01/06 was not described on the prior. Normal heart size without pericardial or pleural effusion. Hepatobiliary: Mild hepatic steatosis. Tiny low-density liver lesions are well-circumscribed and likely cysts. Similar lesions were detailed on the prior report. Normal gallbladder, without biliary ductal dilatation. Pancreas: Normal,  without mass or ductal dilatation. Spleen: Old granulomatous disease in the spleen. Adrenals/Urinary Tract: Normal adrenal glands. No renal calculi or hydronephrosis. No hydroureter or ureteric calculi. No bladder calculi. Upper pole right renal 1.3 cm minimally complex cyst. Interpolar left kidney too small to characterize lesion. No enhancing bladder mass. Stomach/Bowel: Normal stomach, without wall thickening. Descending duodenal diverticulum. Otherwise normal small bowel. Scattered colonic diverticula. Normal terminal ileum and appendix. Vascular/Lymphatic: Aortic atherosclerosis. No abdominopelvic adenopathy. Reproductive: Symmetric seminal vesicles. Nonspecific right mid gland peripheral area of hypoenhancement and hypoattenuation, including at 2.2 cm on 90/12. Other: No significant free fluid. Musculoskeletal: Degenerative disc disease at the L4-5 level. No worrisome focal osseous lesion. IMPRESSION: 1. No acute process or evidence of metastatic disease in the abdomen or pelvis. Nonspecific hypoattenuation and hypoenhancement within the prostatic mid gland. 2. Bibasilar pulmonary nodules. Nonspecific, but unlikely to represent metastatic disease. Consider dedicated chest CT to serve as a baseline. 3.  Aortic Atherosclerosis (ICD10-I70.0). Electronically Signed   By: Abigail Miyamoto M.D.   On: 01/12/2021 09:25      IMPRESSION/PLAN: 1. 64 y.o. gentleman with Stage T1c adenocarcinoma of the prostate with Gleason Score of 4+3, and PSA of 12.7. We discussed the patient's workup and outlined the nature of prostate cancer in this setting. The patient's T stage, Gleason's score, and PSA put him into the unfavorable intermediate risk group. Accordingly, he is eligible for a variety of potential treatment options including brachytherapy, 5.5-8 weeks of external radiation, or prostatectomy. We discussed the available radiation techniques, and focused on the details and logistics of delivery.  We discussed and outlined  the risks, benefits, short and long-term effects associated with radiotherapy and compared and contrasted these with prostatectomy. We discussed the role of SpaceOAR gel in reducing the rectal toxicity associated with radiotherapy.   He appears to have a good understanding of his disease and our treatment recommendations which are of curative intent.  He was encouraged to ask questions that were answered to his stated satisfaction.  At the conclusion of our conversation, the patient is interested in moving forward with prostate seed implant with SpaceOAR insertion.  We personally spent 45 minutes in this encounter including chart review, reviewing radiological studies, meeting face-to-face with the patient, entering orders and completing documentation.      Tyler Pita, MD  [facility]  Radiation Oncology Direct Dial: [phone]  Fax: [phone] [email or web address]  Skype  LinkedIn

## 2021-01-19 ENCOUNTER — Telehealth: Payer: Self-pay | Admitting: *Deleted

## 2021-01-19 NOTE — Telephone Encounter (Signed)
Called patient to ask questions, lvm for a return call

## 2021-01-20 ENCOUNTER — Encounter: Payer: Self-pay | Admitting: Urology

## 2021-01-20 ENCOUNTER — Telehealth: Payer: Self-pay | Admitting: *Deleted

## 2021-01-20 ENCOUNTER — Ambulatory Visit (INDEPENDENT_AMBULATORY_CARE_PROVIDER_SITE_OTHER): Payer: BC Managed Care – PPO | Admitting: Urology

## 2021-01-20 ENCOUNTER — Other Ambulatory Visit: Payer: Self-pay

## 2021-01-20 VITALS — BP 158/83 | HR 73 | Temp 98.4°F | Ht 71.0 in | Wt 188.4 lb

## 2021-01-20 DIAGNOSIS — C61 Malignant neoplasm of prostate: Secondary | ICD-10-CM

## 2021-01-20 LAB — URINALYSIS, ROUTINE W REFLEX MICROSCOPIC
Bilirubin, UA: NEGATIVE
Glucose, UA: NEGATIVE
Ketones, UA: NEGATIVE
Leukocytes,UA: NEGATIVE
Nitrite, UA: NEGATIVE
Protein,UA: NEGATIVE
RBC, UA: NEGATIVE
Specific Gravity, UA: 1.015 (ref 1.005–1.030)
Urobilinogen, Ur: 0.2 mg/dL (ref 0.2–1.0)
pH, UA: 6.5 (ref 5.0–7.5)

## 2021-01-20 NOTE — Progress Notes (Signed)
01/20/2021 3:50 PM   Roger Vega Sep 16, 1955 [id number]  Referring provider: Curlene Labrum, MD [place],  [place] [address]  Followup prostate cancer   HPI: [name] is a 65yo here for followup for prostate cancer. He met with Radiation oncology and has decided to pursue brachytherapy with SpaceOAR. He has mild LUTS. He denies any erectile dysfunction. CT and bone scan shows no evidence of metastatic disease. NO other complaints today.   PMH: Past Medical History:  Diagnosis Date   Arthritis    Bladder cancer ([place])    Depression    GERD (gastroesophageal reflux disease)    Hypercholesteremia    No pertinent past medical history    Renal cyst     Surgical History: Past Surgical History:  Procedure Laterality Date   ANKLE ARTHRODESIS     age 33-bone frag,cyst-rt   BLADDER SURGERY     COLONOSCOPY     ORIF FINGER FRACTURE  06/15/2011   Procedure: OPEN REDUCTION INTERNAL FIXATION (ORIF) METACARPAL (FINGER) FRACTURE;  Surgeon: Wynonia Sours, MD;  Location: [facility];  Service: Orthopedics;  Laterality: Right;  Proximal phalanx right little finger   RETINAL DETACHMENT SURGERY      Home Medications:  Allergies as of 01/20/2021       Reactions   Nsaids Anaphylaxis        Medication List        Accurate as of January 20, 2021  3:50 PM. If you have any questions, ask your nurse or doctor.          acetaminophen 325 MG tablet Commonly known as: TYLENOL Take 650 mg by mouth every 6 (six) hours as needed.   atorvastatin 40 MG tablet Commonly known as: LIPITOR Take 40 mg by mouth daily.   multivitamin capsule Take 1 capsule by mouth daily.   sertraline 100 MG tablet Commonly known as: ZOLOFT Take 100 mg by mouth daily.        Allergies:  Allergies  Allergen Reactions   Nsaids Anaphylaxis    Family History: Family History  Problem Relation Age of Onset   Heart disease Father    Lung cancer Other     Social  History:  reports that he quit smoking about 44 years ago. His smoking use included cigarettes. He has never used smokeless tobacco. He reports that he does not drink alcohol and does not use drugs.  ROS: All other review of systems were reviewed and are negative except what is noted above in HPI  Physical Exam: BP (!) 158/83   Pulse 73   Temp 98.4 F (36.9 C)   Ht 5' 11" (1.803 m)   Wt 188 lb 6.4 oz (85.5 kg)   BMI 26.28 kg/m   Constitutional:  Alert and oriented, No acute distress. HEENT: [place] AT, moist mucus membranes.  Trachea midline, no masses. Cardiovascular: No clubbing, cyanosis, or edema. Respiratory: Normal respiratory effort, no increased work of breathing. GI: Abdomen is soft, nontender, nondistended, no abdominal masses GU: No CVA tenderness.  Lymph: No cervical or inguinal lymphadenopathy. Skin: No rashes, bruises or suspicious lesions. Neurologic: Grossly intact, no focal deficits, moving all 4 extremities. Psychiatric: Normal mood and affect.  Laboratory Data: Lab Results  Component Value Date   HGB 15.6 06/15/2011    Lab Results  Component Value Date   CREATININE 0.90 01/11/2021    Lab Results  Component Value Date   PSA 6.7 (H) 08/27/2019    No results  found for: TESTOSTERONE  No results found for: HGBA1C  Urinalysis    Component Value Date/Time   APPEARANCEUR Clear 11/03/2020 1056   GLUCOSEU Negative 11/03/2020 1056   BILIRUBINUR Negative 11/03/2020 1056   PROTEINUR Negative 11/03/2020 1056   UROBILINOGEN negative (A) 08/27/2019 1102   NITRITE Negative 11/03/2020 1056   LEUKOCYTESUR Negative 11/03/2020 1056    Lab Results  Component Value Date   LABMICR Comment 11/03/2020    Pertinent Imaging: CT 9/19 and bone scan 8/26: Images reviewed and discussed with the patient  No results found for this or any previous visit.  No results found for this or any previous visit.  No results found for this or any previous visit.  No results  found for this or any previous visit.  No results found for this or any previous visit.  No results found for this or any previous visit.  No results found for this or any previous visit.  No results found for this or any previous visit.   Assessment & Plan:    1. Prostate cancer [facility]) I discussed the natural history of intermediate risk prostate cancer with the patient and the various treatment options including active surveillance, RALP, IMRT, brachytherapy, cryotherapy, HIFU and ADT. After discussing the options the patient elects for brachytherapy with SpaceOAR.     No follow-ups on file.  Nicolette Bang, MD  [facility] [place]

## 2021-01-20 NOTE — Progress Notes (Signed)

## 2021-01-20 NOTE — Telephone Encounter (Signed)
RETURNED PATIENT'S WIFE'S PHONE CALL, SPOKE WITH Roger Vega Mis(PATIENT'S WIFE)

## 2021-01-20 NOTE — Addendum Note (Signed)
Addended byIris Pert on: 01/20/2021 04:32 PM   Modules accepted: Orders

## 2021-01-20 NOTE — Patient Instructions (Signed)
Prostate Cancer The prostate is a small gland that produces fluid that makes up semen (seminal fluid). It is located below the bladder in men, in front of the rectum. Prostate cancer is the abnormal growth of cells in the prostate gland. What are the causes? The exact cause of this condition is not known. What increases the risk? You are more likely to develop this condition if: You are 63 years of age or older. You have a family history of prostate cancer. You have a family history of breast and ovarian cancer. You have genes that are passed from parent to child (inherited), such as BRCA1 and BRCA2. You have Lynch syndrome. African American men and men of African descent are diagnosed with prostate cancer at higher rates than other men. The reasons for this are not well understood and are likely due to a combination of genetic and environmental factors. What are the signs or symptoms? Symptoms of this condition include: Problems with urination. This may include: A weak or interrupted flow of urine. Trouble starting or stopping urination. Trouble emptying the bladder all the way. The need to urinate more often, especially at night. Blood in urine or semen. Persistent pain or discomfort in the lower back, lower abdomen, or hips. Trouble getting an erection. Weakness or numbness in the legs or feet. How is this diagnosed? This condition can be diagnosed with: A digital rectal exam. For this exam, a health care provider inserts a gloved finger into the rectum to feel the prostate gland. A blood test called a prostate-specific antigen (PSA) test. A procedure in which a sample of tissue is taken from the prostate and checked under a microscope (prostate biopsy). An imaging test called transrectal ultrasonography. Once the condition is diagnosed, tests will be done to determine how far the cancer has spread. This is called staging the cancer. Staging may involve imaging tests, such as a bone  scan, CT scan, PET scan, or MRI. Stages of prostate cancer The stages of prostate cancer are as follows: Stage 1 (I). At this stage, the cancer is found in the prostate only. The cancer is not visible on imaging tests, and it is usually found by accident, such as during prostate surgery. Stage 2 (II). At this stage, the cancer is more advanced than it is in stage 1, but the cancer has not spread outside the prostate. Stage 3 (III). At this stage, the cancer has spread beyond the outer layer of the prostate to nearby tissues. The cancer may be found in the seminal vesicles, which are near the bladder and the prostate. Stage 4 (IV). At this stage, the cancer has spread to other parts of the body, such as the lymph nodes, bones, bladder, rectum, liver, or lungs. Prostate cancer grading Prostate cancer is also graded according to how the cancer cells look under a microscope. This is called the Gleason score and the total score can range from 6-10, indicating how likely it is that the cancer will spread (metastasize) to other parts of the body. The higher the score, the greater the likelihood that the cancer will spread. Gleason 6 or lower: This indicates that the cancer cells look similar to normal prostate cells (well differentiated). Gleason 7: This indicates that the cancer cells look somewhat similar to normal prostate cells (moderately differentiated). Gleason 8, 9, or 10: This indicates that the cancer cells look very different than normal prostate cells (poorly differentiated). How is this treated? Treatment for this condition depends on several factors,  including the stage of the cancer, your age, personal preferences, and your overall health. Talk with your health care provider about treatment options that are recommended for you. Common treatments include: Observation for early stage prostate cancer (active surveillance). This involves having exams, blood tests, and in some cases, more biopsies.  For some men, this is the only treatment needed. Surgery. Types of surgeries include: Open surgery (radical prostatectomy). In this surgery, a larger incision is made to remove the prostate. A laparoscopic radical prostatectomy. This is a surgery to remove the prostate and lymph nodes through several small incisions. It is often referred to as a minimally invasive surgery. A robotic radical prostatectomy. This is laparoscopic surgery to remove the prostate and lymph nodes with the help of robotic arms that are controlled by the surgeon. Cryoablation. This is surgery to freeze and destroy cancer cells. Radiation treatment. Types of radiation treatment include: External beam radiation. This type aims beams of radiation from outside the body at the prostate to destroy cancerous cells. Brachytherapy. This type uses radioactive needles, seeds, wires, or tubes that are implanted into the prostate gland. Like external beam radiation, brachytherapy destroys cancerous cells. An advantage is that this type of radiation limits the damage to surrounding tissue and has fewer side effects. Chemotherapy. This treatment kills cancer cells or stops them from multiplying. It kills both cancer cells and normal cells. Targeted therapy. This treatment uses medicines to kill cancer cells without damaging normal cells. Hormone treatment. This treatment involves taking medicines that act on testosterone, one of the male hormones, by: Stopping your body from producing testosterone. Blocking testosterone from reaching cancer cells. Follow these instructions at home: Lifestyle Do not use any products that contain nicotine or tobacco. These products include cigarettes, chewing tobacco, and vaping devices, such as e-cigarettes. If you need help quitting, ask your health care provider. Eat a healthy diet. To do this: Eat foods that are high in fiber. These include beans, whole grains, and fresh fruits and vegetables. Limit  foods that are high in fat and sugar. These include fried or sweet foods. Treatment for prostate cancer may affect sexual function. If you have a partner, continue to have intimate moments. This may include touching, holding, hugging, and caressing your partner. Get plenty of sleep. Consider joining a support group for men who have prostate cancer. Meeting with a support group may help you learn to manage the stress of having cancer. General instructions Take over-the-counter and prescription medicines only as told by your health care provider. If you have to go to the hospital, notify your cancer specialist (oncologist). Keep all follow-up visits. This is important. Where to find more information American Cancer Society: www.cancer.org American Society of Clinical Oncology: www.cancer.net National Cancer Institute: www.cancer.gov Contact a health care provider if: You have new or increasing trouble urinating. You have new or increasing blood in your urine. You have new or increasing pain in your hips, back, or chest. Get help right away if: You have weakness or numbness in your legs. You cannot control urination or your bowel movements (incontinence). You have chills or a fever. Summary The prostate is a small gland that is involved in the production of semen. It is located below a man's bladder, in front of the rectum. Prostate cancer is the abnormal growth of cells in the prostate gland. Treatment for this condition depends on the stage of the cancer, your age, personal preferences, and your overall health. Talk with your health care provider about treatment   options that are recommended for you. Consider joining a support group for men who have prostate cancer. Meeting with a support group may help you learn to manage the stress of having cancer. This information is not intended to replace advice given to you by your health care provider. Make sure you discuss any questions you have with  your health care provider. Document Revised: 07/08/2020 Document Reviewed: 07/08/2020 Elsevier Patient Education  2022 [employer] Inc.  

## 2021-01-20 NOTE — Telephone Encounter (Signed)
RETURNED PATIENT'S PHONE CALL, LVM FOR A CALL

## 2021-01-22 ENCOUNTER — Encounter: Payer: Self-pay | Admitting: Urology

## 2021-01-22 ENCOUNTER — Telehealth: Payer: Self-pay | Admitting: *Deleted

## 2021-01-22 NOTE — Telephone Encounter (Signed)
Returned patient's phone call, spoke with patient's wife- Betty 

## 2021-02-24 ENCOUNTER — Telehealth: Payer: Self-pay | Admitting: *Deleted

## 2021-02-24 NOTE — Progress Notes (Signed)
  Radiation Oncology         ([phone]) [phone] ________________________________  Name: Roger Vega MRN: [id number]  Date: 02/25/2021  DOB: 1955/06/21  SIMULATION AND TREATMENT PLANNING NOTE PUBIC ARCH STUDY  [id number], Virgina Evener, MD  Alyson Ingles Candee Furbish, MD  DIAGNOSIS:  65 y.o. gentleman with Stage T1c adenocarcinoma of the prostate with Gleason Score of 4+3, and PSA of 12.7.  Oncology History   No history exists.      ICD-10-CM   1. Prostate cancer ([place])  C61       COMPLEX SIMULATION:  The patient presented today for evaluation for possible prostate seed implant. He was brought to the radiation planning suite and placed supine on the CT couch. A 3-dimensional image study set was obtained in upload to the planning computer. There, on each axial slice, I contoured the prostate gland. Then, using three-dimensional radiation planning tools I reconstructed the prostate in view of the structures from the transperineal needle pathway to assess for possible pubic arch interference. In doing so, I did not appreciate any pubic arch interference. Also, the patient's prostate volume was estimated based on the drawn structure. The volume was 36 cc.  Given the pubic arch appearance and prostate volume, patient remains a good candidate to proceed with prostate seed implant. Today, he freely provided informed written consent to proceed.    PLAN: The patient will undergo prostate seed implant.   ________________________________  Sheral Apley. Tammi Klippel, M.D.

## 2021-02-24 NOTE — Telephone Encounter (Signed)
CALLED PATIENT TO REMIND OF PRE-SEED APPTS. FOR 02-25-21, SPOKE WITH PATIENT'S WIFE- BETTY AND SHE IS AWARE OF THESE APPTS.

## 2021-02-25 ENCOUNTER — Encounter (HOSPITAL_COMMUNITY)
Admission: RE | Admit: 2021-02-25 | Discharge: 2021-02-25 | Disposition: A | Discharge status: Home / Self Care | Payer: BC Managed Care – PPO | Source: Ambulatory Visit | Attending: Urology | Admitting: Urology

## 2021-02-25 ENCOUNTER — Ambulatory Visit
Admission: RE | Admit: 2021-02-25 | Discharge: 2021-02-25 | Disposition: A | Discharge status: Home / Self Care | Payer: BC Managed Care – PPO | Source: Ambulatory Visit | Attending: Radiation Oncology | Admitting: Radiation Oncology

## 2021-02-25 ENCOUNTER — Ambulatory Visit
Admission: RE | Admit: 2021-02-25 | Discharge: 2021-02-25 | Disposition: A | Discharge status: Home / Self Care | Payer: BC Managed Care – PPO | Source: Ambulatory Visit | Attending: Urology | Admitting: Urology

## 2021-02-25 ENCOUNTER — Ambulatory Visit (HOSPITAL_COMMUNITY)
Admission: RE | Admit: 2021-02-25 | Discharge: 2021-02-25 | Disposition: A | Discharge status: Home / Self Care | Payer: BC Managed Care – PPO | Source: Ambulatory Visit | Attending: Urology | Admitting: Urology

## 2021-02-25 ENCOUNTER — Encounter: Payer: Self-pay | Admitting: Urology

## 2021-02-25 ENCOUNTER — Other Ambulatory Visit: Payer: Self-pay

## 2021-02-25 DIAGNOSIS — Z01818 Encounter for other preprocedural examination: Secondary | ICD-10-CM | POA: Diagnosis present

## 2021-02-25 DIAGNOSIS — C61 Malignant neoplasm of prostate: Secondary | ICD-10-CM

## 2021-02-25 NOTE — Progress Notes (Signed)
Patient states doing well. No symptoms reported this time.  Meaningful use complete.  No current urinary management medications. No current urology follow-up. Last office visit w/ urology was on September 19th, 2022 -per [facility].  There were no vitals taken for this visit.

## 2021-03-08 ENCOUNTER — Telehealth: Payer: Self-pay | Admitting: *Deleted

## 2021-03-08 NOTE — Telephone Encounter (Signed)
CALLED PATIENT TO REMIND OF LABS FOR 03-12-21, SPOKE WITH PATIENT'S WIFE- BETTY AND SHE IS AWARE OF THIS APPT.

## 2021-03-11 ENCOUNTER — Encounter (HOSPITAL_BASED_OUTPATIENT_CLINIC_OR_DEPARTMENT_OTHER): Payer: Self-pay | Admitting: Urology

## 2021-03-11 ENCOUNTER — Other Ambulatory Visit: Payer: Self-pay

## 2021-03-11 DIAGNOSIS — Z973 Presence of spectacles and contact lenses: Secondary | ICD-10-CM

## 2021-03-11 HISTORY — DX: Presence of spectacles and contact lenses: Z97.3

## 2021-03-11 NOTE — Progress Notes (Addendum)
Spoke w/ via phone for pre-op interview---pt wife Roger Vega per patient request Lab needs dos----   none            Lab results------lab appt 03-12-2021 for cbc cmp, pt ptt  @1430  COVID test -----patient  wife Roger Vega states  patient asymptomatic no test needed Arrive at -------1130 am 03-15-2021 NPO after MN NO Solid Food.  Clear liquids from MN until---1030 am Med rec completed Medications to take morning of surgery ----- atorvastatin, sertraline, flexeril prn Diabetic medication -----n/a Patient instructed no nail polish to be worn day of surgery Patient instructed to bring photo id and insurance card day of surgery Patient aware to have Driver (ride ) / caregiver    for 24 hours after surgery  wife Roger Vega Patient Special Instructions -----fleets enema am of surgery Pre-Op special Istructions -----none Patient verbalized understanding of instructions that were given at this phone interview. Patient denies shortness of breath, chest pain, fever, cough at this phone interview.   Ekg 02-25-2021 chart/epic Chest xray 02-25-2021 chart/epic  Addendum: patient spouse Roger Vega called and stated dr Roger Vega will send patient home without foley catheter day of surgery and dr Roger Vega will request a few in and out catheters to be sent home with patient day of surgery, patient wife Roger Vega instructed to tell pre op nurse day of surgery that dr Roger Vega wishes for patient to go home with a few in and out catheters day of surgery due to patient spouse in RN and knows how to  in and out cath patient if needed.

## 2021-03-12 ENCOUNTER — Encounter (HOSPITAL_COMMUNITY)
Admission: RE | Admit: 2021-03-12 | Discharge: 2021-03-12 | Disposition: A | Discharge status: Home / Self Care | Payer: BC Managed Care – PPO | Source: Ambulatory Visit | Attending: Urology | Admitting: Urology

## 2021-03-12 ENCOUNTER — Telehealth: Payer: Self-pay | Admitting: *Deleted

## 2021-03-12 ENCOUNTER — Other Ambulatory Visit (HOSPITAL_COMMUNITY): Payer: BC Managed Care – PPO

## 2021-03-12 DIAGNOSIS — Z01812 Encounter for preprocedural laboratory examination: Secondary | ICD-10-CM | POA: Insufficient documentation

## 2021-03-12 DIAGNOSIS — Z87891 Personal history of nicotine dependence: Secondary | ICD-10-CM | POA: Diagnosis not present

## 2021-03-12 DIAGNOSIS — Z8551 Personal history of malignant neoplasm of bladder: Secondary | ICD-10-CM | POA: Diagnosis not present

## 2021-03-12 DIAGNOSIS — C61 Malignant neoplasm of prostate: Secondary | ICD-10-CM | POA: Diagnosis not present

## 2021-03-12 LAB — COMPREHENSIVE METABOLIC PANEL
ALT: 22 U/L (ref 0–44)
AST: 26 U/L (ref 15–41)
Albumin: 4.5 g/dL (ref 3.5–5.0)
Alkaline Phosphatase: 58 U/L (ref 38–126)
Anion gap: 6 (ref 5–15)
BUN: 15 mg/dL (ref 8–23)
CO2: 27 mmol/L (ref 22–32)
Calcium: 8.9 mg/dL (ref 8.9–10.3)
Chloride: 106 mmol/L (ref 98–111)
Creatinine, Ser: 0.83 mg/dL (ref 0.61–1.24)
GFR, Estimated: >60 mL/min (ref >60)
Glucose, Bld: 125 mg/dL — ABNORMAL HIGH (ref 70–99)
Potassium: 4.1 mmol/L (ref 3.5–5.1)
Sodium: 139 mmol/L (ref 135–145)
Total Bilirubin: 0.8 mg/dL (ref 0.3–1.2)
Total Protein: 7.1 g/dL (ref 6.5–8.1)

## 2021-03-12 LAB — CBC
HCT: 45.1 % (ref 39.0–52.0)
Hemoglobin: 15.1 g/dL (ref 13.0–17.0)
MCH: 28.7 pg (ref 26.0–34.0)
MCHC: 33.5 g/dL (ref 30.0–36.0)
MCV: 85.7 fL (ref 80.0–100.0)
Platelets: 276 10*3/uL (ref 150–400)
RBC: 5.26 MIL/uL (ref 4.22–5.81)
RDW: 12.6 % (ref 11.5–15.5)
WBC: 5.6 10*3/uL (ref 4.0–10.5)
nRBC: 0 % (ref 0.0–0.2)

## 2021-03-12 LAB — APTT: aPTT: 27 seconds (ref 24–36)

## 2021-03-12 LAB — PROTIME-INR
INR: 0.9 (ref 0.8–1.2)
Prothrombin Time: 12.6 seconds (ref 11.4–15.2)

## 2021-03-12 NOTE — Telephone Encounter (Signed)
CALLED PATIENT TO REMIND OF PROCEDURE FOR 03-15-21, SPOKE WITH PATIENT'S WIFE- Roger Vega AND SHE IS AWARE OF THIS PROCEDURE

## 2021-03-14 NOTE — Progress Notes (Signed)
  Radiation Oncology         ([phone]) [phone] ________________________________  Name: Roger Vega MRN: [id number]  Date: 03/15/2021  DOB: August 02, 1955       Prostate Seed Implant  FX:[id number], Virgina Evener, MD  No ref. provider found  DIAGNOSIS:  65 y.o. gentleman with Stage T1c adenocarcinoma of the prostate with Gleason Score of 4+3, and PSA of 12.7.  Oncology History   No history exists.    No diagnosis found.  PROCEDURE: Insertion of radioactive I-125 seeds into the prostate gland.  RADIATION DOSE: 145 Gy, definitive therapy.  TECHNIQUE: Roger Vega was brought to the operating room with the urologist. He was placed in the dorsolithotomy position. He was catheterized and a rectal tube was inserted. The perineum was shaved, prepped and draped. The ultrasound probe was then introduced into the rectum to see the prostate gland.  TREATMENT DEVICE: A needle grid was attached to the ultrasound probe stand and anchor needles were placed.  3D PLANNING: The prostate was imaged in 3D using a sagittal sweep of the prostate probe. These images were transferred to the planning computer. There, the prostate, urethra and rectum were defined on each axial reconstructed image. Then, the software created an optimized 3D plan and a few seed positions were adjusted. The quality of the plan was reviewed using [facility] information for the target and the following two organs at risk:  Urethra and Rectum.  Then the accepted plan was printed and handed off to the radiation therapist.  Under my supervision, the custom loading of the seeds and spacers was carried out and loaded into sealed vicryl sleeves.  These pre-loaded needles were then placed into the needle holder.Marland Kitchen  PROSTATE VOLUME STUDY:  Using transrectal ultrasound the volume of the prostate was verified to be 38.6 cc.  SPECIAL TREATMENT PROCEDURE/SUPERVISION AND HANDLING: The pre-loaded needles were then delivered under sagittal guidance. A total of 19  needles were used to deposit 64 seeds in the prostate gland. The individual seed activity was .0448 mCi.  SpaceOAR:  No, insurance denied  COMPLEX SIMULATION: At the end of the procedure, an anterior radiograph of the pelvis was obtained to document seed positioning and count. Cystoscopy was performed to check the urethra and bladder.  MICRODOSIMETRY: At the end of the procedure, the patient was emitting 0.107 mR/hr at 1 meter. Accordingly, he was considered safe for hospital discharge.  PLAN: The patient will return to the radiation oncology clinic for post implant CT dosimetry in three weeks.   ________________________________  Sheral Apley Tammi Klippel, M.D.

## 2021-03-15 ENCOUNTER — Encounter (HOSPITAL_BASED_OUTPATIENT_CLINIC_OR_DEPARTMENT_OTHER)
Admission: RE | Disposition: A | Discharge status: Home / Self Care | Payer: Self-pay | Source: Ambulatory Visit | Attending: Urology

## 2021-03-15 ENCOUNTER — Ambulatory Visit (HOSPITAL_BASED_OUTPATIENT_CLINIC_OR_DEPARTMENT_OTHER): Payer: BC Managed Care – PPO | Admitting: Certified Registered Nurse Anesthetist

## 2021-03-15 ENCOUNTER — Encounter (HOSPITAL_BASED_OUTPATIENT_CLINIC_OR_DEPARTMENT_OTHER): Payer: Self-pay | Admitting: Urology

## 2021-03-15 ENCOUNTER — Ambulatory Visit (HOSPITAL_BASED_OUTPATIENT_CLINIC_OR_DEPARTMENT_OTHER)
Admission: RE | Admit: 2021-03-15 | Discharge: 2021-03-15 | Disposition: A | Discharge status: Home / Self Care | Payer: BC Managed Care – PPO | Source: Ambulatory Visit | Attending: Urology | Admitting: Urology

## 2021-03-15 ENCOUNTER — Ambulatory Visit (HOSPITAL_COMMUNITY): Payer: BC Managed Care – PPO

## 2021-03-15 DIAGNOSIS — C61 Malignant neoplasm of prostate: Secondary | ICD-10-CM | POA: Insufficient documentation

## 2021-03-15 DIAGNOSIS — Z8551 Personal history of malignant neoplasm of bladder: Secondary | ICD-10-CM | POA: Insufficient documentation

## 2021-03-15 DIAGNOSIS — Z87891 Personal history of nicotine dependence: Secondary | ICD-10-CM | POA: Insufficient documentation

## 2021-03-15 HISTORY — DX: Anxiety disorder, unspecified: F41.9

## 2021-03-15 HISTORY — PX: CYSTOSCOPY: SHX5120

## 2021-03-15 HISTORY — PX: RADIOACTIVE SEED IMPLANT: SHX5150

## 2021-03-15 HISTORY — DX: Tinnitus, unspecified ear: H93.19

## 2021-03-15 HISTORY — DX: Personal history of COVID-19: Z86.16

## 2021-03-15 HISTORY — DX: Other specified postprocedural states: Z98.890

## 2021-03-15 SURGERY — INSERTION, RADIATION SOURCE, PROSTATE
Anesthesia: General | Site: Urethra

## 2021-03-15 MED ORDER — DEXAMETHASONE SODIUM PHOSPHATE 10 MG/ML IJ SOLN
INTRAMUSCULAR | Status: DC | PRN
  Administered 2021-03-15: 5 mg via INTRAVENOUS

## 2021-03-15 MED ORDER — ROCURONIUM BROMIDE 10 MG/ML (PF) SYRINGE
PREFILLED_SYRINGE | INTRAVENOUS | Status: AC
  Filled 2021-03-15: qty 10

## 2021-03-15 MED ORDER — OXYCODONE HCL 5 MG PO TABS: 5.0000 mg | ORAL_TABLET | Freq: Once | ORAL | Status: DC | PRN

## 2021-03-15 MED ORDER — ROCURONIUM BROMIDE 100 MG/10ML IV SOLN
INTRAVENOUS | Status: DC | PRN
  Administered 2021-03-15: 60 mg via INTRAVENOUS

## 2021-03-15 MED ORDER — SUGAMMADEX SODIUM 200 MG/2ML IV SOLN
INTRAVENOUS | Status: DC | PRN
  Administered 2021-03-15: 200 mg via INTRAVENOUS

## 2021-03-15 MED ORDER — LIDOCAINE 2% (20 MG/ML) 5 ML SYRINGE
INTRAMUSCULAR | Status: AC
  Filled 2021-03-15: qty 5

## 2021-03-15 MED ORDER — OXYCODONE HCL 5 MG/5ML PO SOLN: 5.0000 mg | Freq: Once | ORAL | Status: DC | PRN

## 2021-03-15 MED ORDER — SODIUM CHLORIDE 0.9 % IV SOLN
INTRAVENOUS | Status: AC | PRN
  Administered 2021-03-15: 1000 mL

## 2021-03-15 MED ORDER — CEFAZOLIN SODIUM-DEXTROSE 2-4 GM/100ML-% IV SOLN
INTRAVENOUS | Status: AC
  Filled 2021-03-15: qty 100

## 2021-03-15 MED ORDER — ONDANSETRON HCL 4 MG/2ML IJ SOLN
INTRAMUSCULAR | Status: DC | PRN
  Administered 2021-03-15: 4 mg via INTRAVENOUS

## 2021-03-15 MED ORDER — PROMETHAZINE HCL 25 MG/ML IJ SOLN: 6.2500 mg | INTRAMUSCULAR | Status: DC | PRN

## 2021-03-15 MED ORDER — SCOPOLAMINE 1 MG/3DAYS TD PT72
MEDICATED_PATCH | TRANSDERMAL | Status: AC
  Filled 2021-03-15: qty 1

## 2021-03-15 MED ORDER — ONDANSETRON HCL 4 MG/2ML IJ SOLN
INTRAMUSCULAR | Status: AC
  Filled 2021-03-15: qty 2

## 2021-03-15 MED ORDER — STERILE WATER FOR IRRIGATION IR SOLN
Status: DC | PRN
  Administered 2021-03-15: 500 mL

## 2021-03-15 MED ORDER — EPHEDRINE 5 MG/ML INJ
INTRAVENOUS | Status: AC
  Filled 2021-03-15: qty 5

## 2021-03-15 MED ORDER — TRAMADOL HCL 50 MG PO TABS: 50.0000 mg | ORAL_TABLET | Freq: Four times a day (QID) | ORAL | 0 refills | Status: AC | PRN

## 2021-03-15 MED ORDER — DROPERIDOL 2.5 MG/ML IJ SOLN: 0.6250 mg | Freq: Once | INTRAMUSCULAR | Status: DC | PRN

## 2021-03-15 MED ORDER — LIDOCAINE 2% (20 MG/ML) 5 ML SYRINGE
INTRAMUSCULAR | Status: DC | PRN
  Administered 2021-03-15: 50 mg via INTRAVENOUS

## 2021-03-15 MED ORDER — EPHEDRINE SULFATE 50 MG/ML IJ SOLN
INTRAMUSCULAR | Status: DC | PRN
  Administered 2021-03-15: 10 mg via INTRAVENOUS
  Administered 2021-03-15: 15 mg via INTRAVENOUS
  Administered 2021-03-15: 10 mg via INTRAVENOUS

## 2021-03-15 MED ORDER — PROPOFOL 10 MG/ML IV BOLUS
INTRAVENOUS | Status: DC | PRN
  Administered 2021-03-15: 170 mg via INTRAVENOUS

## 2021-03-15 MED ORDER — FLEET ENEMA 7-19 GM/118ML RE ENEM: 1.0000 | ENEMA | Freq: Once | RECTAL | Status: DC

## 2021-03-15 MED ORDER — DEXAMETHASONE SODIUM PHOSPHATE 10 MG/ML IJ SOLN
INTRAMUSCULAR | Status: AC
  Filled 2021-03-15: qty 1

## 2021-03-15 MED ORDER — PROPOFOL 10 MG/ML IV BOLUS
INTRAVENOUS | Status: AC
  Filled 2021-03-15: qty 20

## 2021-03-15 MED ORDER — MIDAZOLAM HCL 5 MG/5ML IJ SOLN
INTRAMUSCULAR | Status: DC | PRN
  Administered 2021-03-15: 2 mg via INTRAVENOUS

## 2021-03-15 MED ORDER — FENTANYL CITRATE (PF) 100 MCG/2ML IJ SOLN
INTRAMUSCULAR | Status: DC | PRN
  Administered 2021-03-15: 25 ug via INTRAVENOUS
  Administered 2021-03-15: 50 ug via INTRAVENOUS

## 2021-03-15 MED ORDER — FENTANYL CITRATE (PF) 100 MCG/2ML IJ SOLN: 25.0000 ug | INTRAMUSCULAR | Status: DC | PRN

## 2021-03-15 MED ORDER — CEFAZOLIN SODIUM-DEXTROSE 2-4 GM/100ML-% IV SOLN
2.0000 g | Freq: Once | INTRAVENOUS | Status: AC
  Administered 2021-03-15: 2 g via INTRAVENOUS

## 2021-03-15 MED ORDER — MIDAZOLAM HCL 2 MG/2ML IJ SOLN
INTRAMUSCULAR | Status: AC
  Filled 2021-03-15: qty 2

## 2021-03-15 MED ORDER — FENTANYL CITRATE (PF) 250 MCG/5ML IJ SOLN
INTRAMUSCULAR | Status: AC
  Filled 2021-03-15: qty 5

## 2021-03-15 MED ORDER — LACTATED RINGERS IV SOLN: INTRAVENOUS | Status: DC

## 2021-03-15 MED ORDER — SCOPOLAMINE 1 MG/3DAYS TD PT72
1.0000 | MEDICATED_PATCH | TRANSDERMAL | Status: DC
  Administered 2021-03-15: 1.5 mg via TRANSDERMAL

## 2021-03-15 MED ORDER — IOHEXOL 300 MG/ML  SOLN
INTRAMUSCULAR | Status: DC | PRN
  Administered 2021-03-15: 7 mL

## 2021-03-15 SURGICAL SUPPLY — 35 items
BAG DRN RND TRDRP ANRFLXCHMBR (UROLOGICAL SUPPLIES) ×2
BAG URINE DRAIN 2000ML AR STRL (UROLOGICAL SUPPLIES) ×3 IMPLANT
BLADE CLIPPER SENSICLIP SURGIC (BLADE) ×3 IMPLANT
CATH FOLEY 2WAY SLVR  5CC 16FR (CATHETERS) ×1
CATH FOLEY 2WAY SLVR 5CC 16FR (CATHETERS) ×2 IMPLANT
CATH ROBINSON RED A/P 20FR (CATHETERS) ×3 IMPLANT
CLOTH BEACON ORANGE TIMEOUT ST (SAFETY) ×3 IMPLANT
COVER BACK TABLE 60X90IN (DRAPES) ×3 IMPLANT
COVER MAYO STAND STRL (DRAPES) ×3 IMPLANT
DRSG TEGADERM 4X4.75 (GAUZE/BANDAGES/DRESSINGS) ×3 IMPLANT
DRSG TEGADERM 8X12 (GAUZE/BANDAGES/DRESSINGS) ×3 IMPLANT
GAUZE SPONGE 4X4 12PLY STRL (GAUZE/BANDAGES/DRESSINGS) ×3 IMPLANT
GEL ULTRASOUND 20GR AQUASONIC (MISCELLANEOUS) ×3 IMPLANT
GLOVE SURG ENC MOIS LTX SZ6.5 (GLOVE) ×3 IMPLANT
GLOVE SURG ENC MOIS LTX SZ8 (GLOVE) ×3 IMPLANT
GLOVE SURG ORTHO LTX SZ8.5 (GLOVE) ×9 IMPLANT
GLOVE SURG UNDER POLY LF SZ6 (GLOVE) ×3 IMPLANT
GLOVE SURG UNDER POLY LF SZ6.5 (GLOVE) ×3 IMPLANT
GLOVE SURG UNDER POLY LF SZ7 (GLOVE) ×3 IMPLANT
GOWN STRL REUS W/TWL LRG LVL3 (GOWN DISPOSABLE) ×6 IMPLANT
GOWN STRL REUS W/TWL XL LVL3 (GOWN DISPOSABLE) ×3 IMPLANT
GRID BRACH TEMP 18GA 2.8X3X.75 (MISCELLANEOUS) ×3 IMPLANT
HOLDER FOLEY CATH W/STRAP (MISCELLANEOUS) ×3 IMPLANT
I-125 Implant Seed ×192 IMPLANT
IV NS 1000ML (IV SOLUTION) ×3
IV NS 1000ML BAXH (IV SOLUTION) ×2 IMPLANT
KIT TURNOVER CYSTO (KITS) ×3 IMPLANT
NEEDLE BRACHY 18G 5PK (NEEDLE) ×12 IMPLANT
NEEDLE PK MORGANSTERN STABILIZ (NEEDLE) ×3 IMPLANT
PACK CYSTO (CUSTOM PROCEDURE TRAY) ×3 IMPLANT
SHEATH ULTRASOUND LTX NONSTRL (SHEATH) ×3 IMPLANT
SYR 10ML LL (SYRINGE) ×6 IMPLANT
TOWEL OR 17X26 10 PK STRL BLUE (TOWEL DISPOSABLE) ×6 IMPLANT
UNDERPAD 30X36 HEAVY ABSORB (UNDERPADS AND DIAPERS) ×6 IMPLANT
WATER STERILE IRR 500ML POUR (IV SOLUTION) ×3 IMPLANT

## 2021-03-15 NOTE — Anesthesia Preprocedure Evaluation (Signed)
Anesthesia Evaluation  Patient identified by MRN, date of birth, ID band Patient awake    Reviewed: Allergy & Precautions, NPO status , Patient's Chart, lab work & pertinent test results  History of Anesthesia Complications (+) PONV and history of anesthetic complications  Airway Mallampati: II  TM Distance: >3 FB Neck ROM: Full    Dental no notable dental hx. (+) Caps, Dental Advisory Given, Teeth Intact   Pulmonary former smoker,    Pulmonary exam normal breath sounds clear to auscultation       Cardiovascular negative cardio ROS Normal cardiovascular exam Rhythm:Regular Rate:Normal     Neuro/Psych Anxiety negative neurological ROS     GI/Hepatic negative GI ROS, Neg liver ROS,   Endo/Other  Hypercholesterolemia  Renal/GU Renal diseaseHx/o renal cyst   Hx/o Bladder Ca Prostate Ca    Musculoskeletal  (+) Arthritis , Osteoarthritis,    Abdominal   Peds  Hematology negative hematology ROS (+)   Anesthesia Other Findings   Reproductive/Obstetrics                             Anesthesia Physical Anesthesia Plan  ASA: 2  Anesthesia Plan: General   Post-op Pain Management:    Induction: Intravenous  PONV Risk Score and Plan: 4 or greater and Treatment may vary due to age or medical condition, Ondansetron, Dexamethasone, Midazolam and Scopolamine patch - Pre-op  Airway Management Planned: Oral ETT  Additional Equipment: None  Intra-op Plan:   Post-operative Plan:   Informed Consent: I have reviewed the patients History and Physical, chart, labs and discussed the procedure including the risks, benefits and alternatives for the proposed anesthesia with the patient or authorized representative who has indicated his/her understanding and acceptance.     Dental advisory given  Plan Discussed with: CRNA and Anesthesiologist  Anesthesia Plan Comments:         Anesthesia  Quick Evaluation

## 2021-03-15 NOTE — Discharge Instructions (Signed)
PROSTATE CANCER TREATMENT WITH RADIOACTIVE IODINE-125 SEED IMPLANT  This instruction sheet is intended to discuss implantation of Iodine-125 seeds as treatment for cancer of the prostate. It will explain in detail what you may expect from this treatment and what precautions are necessary as a result of the treatment. Iodine-125 emits a relatively low energy radiation. The radioactive seeds are surgically implanted directly into the prostate gland. Most of the radiation is contained within the prostate gland. A very small amount is present outside the body.The precautions that we ask you to take are to ensure that those around you are protected from unnecessary radiation. The principles of radiation safety that you need to understand are:  DISTANCE: The further a person is from the radioactive implant the less radiation they will be receiving. The amount of radiation received falls off quite rapidly with distance. More specific guidelines are given in the table on the last page.  TIME: The amount of radiation a person is exposed to is directly proportional to the amount of time that is spent in close proximity to the radioactive implant. Very little radiation will be received during short periods. See the table on the last page for more specific guideline.  CHILDREN UNDER AGE 26 Children should not be allowed to sit on your lap or otherwise be in very close contact for more than a few minutes for the first 6-8 weeks following the implant. You may affectionately greet (hug/kiss) a child for a short period of time, but remember, the longer you are in close proximity with that child the more radiation they are being exposed to. At a distance of 6 feet there is no limit to the length of time you may spend together. See specific guidelines on the last page.  PREGNANT OR POSSIBLY PREGNANT WOMEN Pregnant women should avoid prolonged close physical contact with you for the first 6-8 weeks after implant. At a  distance of 6 feet there is no limit to the length of time you may spend together. Pregnant women or possibly pregnant women can safely be in close contact with you for a limited period of time. See the last page for guidelines.  FAMILY RELATIONS You may sleep in the same bed as your partner (provided she is not pregnant or under the age of 60). Sexual intercourse, using a condom, may be resumed 2 weeks after the implant. Your semen may be discolored, dark brown or black. This is normal and is the result of bleeding that may have occurred during the implant. After 3-4 weeks it will not be necessary to use a condom.  DAILY ACTIVITIES You may resume normal activities in a few days (example: work, shopping, church) without the risk of harmful radiation exposure to those around you provided you keep in mind the time and distance precautions. Objects that you touch or item that you use do not become radioactive. Linens, clothing, tableware, and dishes may be used by other persons without special precautions. Your bodily wastes (urine and stool) are not radioactive.  SPECIAL PRECAUTIONS It is possible to lose implanted Iodine-125 seed(s) through urination. Although it is possible to pass seeds indefinitely, it is most likely to occur immediately after catheter removal. To prevent this from happening the catheter that was in place during the implant procedure is removed immediately after the implant and a cystoscopy procedure is performed. The process of removing the catheter and the cystoscopy procedure should dislodge and remove any seeds that are not firmly imbedded in the prostate  tissue. However, you should watch for seeds if/when you remove your catheter at home. The seeds are silver colored and the size of a grain of rice. In the unlikely event that a seed is seen after urination, simply flush the seed down the toilet. The seed should not be handled with your fingers, not even with a glove or napkin. A  spoon or tweezers can be used to pick up a seed. The Radiation Oncology department is open Monday - Friday from 8:00 am to 5:30 pm with a Radiation Oncologist on call at all times. He or she may be reached by calling [phone]. If you are to be hospitalized or if death should occur, your family should notify the Runner, broadcasting/film/video.  SIDE EFFECTS There are very few side effects associate with the implant procedure. Minor burning with urination, weak stream, hesitancy, intermittency, frequency, mild pain or feeling unable to pass your urine freely are common and usually stop in one to four months. If these symptoms are extremely uncomfortable, contact your physician.  RADIATION SAFETY GUIDELINES PROSTATE CANCER TREATMENT WITH RADIOACTIVE IODINE-125 SEED IMPLANT  The following guidelines will limit exposure to less than naturally occurring background radiation.  PERSONS AGE 72-45 (if able to become pregnant)  FOR 8 WEEKS FOLLOWING IMPLANT  At a distance of 1 foot: limit time to less than 2 hours/week At a distance of 3 feet: limit time to 20 hours/week At a distance of 6 feet: no restrictions  AFTER 8 WEEKS No restrictions  CHILDREN UNDER AGE 72, PREGNANT WOMEN OR POSSIBLY PREGNANT WOMEN  FOR 8 WEEKS FOLLOWING IMPLANT At a distance of 1 foot: limit time to 10 minutes/week At a distance of 3 feet: limit time to 2 hours/week At a distance of 6 feet: no restrictions  AFTER 8 WEEKS No restrictions  PERSONS OVER THE AGE OF 45 AND DO NOT EXPECT TO HAVE ANY MORE CHILDREN No restrictions  Updated by [facility] in January 2020      Post Anesthesia Home Care Instructions  Activity: Get plenty of rest for the remainder of the day. A responsible individual must stay with you for 24 hours following the procedure.  For the next 24 hours, DO NOT: -Drive a car -Paediatric nurse -Drink alcoholic beverages -Take any medication unless instructed by your physician -Make any legal  decisions or sign important papers.  Meals: Start with liquid foods such as gelatin or soup. Progress to regular foods as tolerated. Avoid greasy, spicy, heavy foods. If nausea and/or vomiting occur, drink only clear liquids until the nausea and/or vomiting subsides. Call your physician if vomiting continues.  Special Instructions/Symptoms: Your throat may feel dry or sore from the anesthesia or the breathing tube placed in your throat during surgery. If this causes discomfort, gargle with warm salt water. The discomfort should disappear within 24 hours.  If you had a scopolamine patch placed behind your ear for the management of post- operative nausea and/or vomiting:  1. The medication in the patch is effective for 72 hours, after which it should be removed.  Wrap patch in a tissue and discard in the trash. Wash hands thoroughly with soap and water. 2. You may remove the patch earlier than 72 hours if you experience unpleasant side effects which may include dry mouth, dizziness or visual disturbances. 3. Avoid touching the patch. Wash your hands with soap and water after contact with the patch.

## 2021-03-15 NOTE — Anesthesia Postprocedure Evaluation (Signed)
Anesthesia Post Note  Patient: Roger Vega  Procedure(s) Performed: RADIOACTIVE SEED IMPLANT/BRACHYTHERAPY IMPLANT (Prostate) CYSTOSCOPY (Urethra)     Patient location during evaluation: PACU Anesthesia Type: General Level of consciousness: awake and alert and oriented Pain management: pain level controlled Vital Signs Assessment: post-procedure vital signs reviewed and stable Respiratory status: spontaneous breathing, nonlabored ventilation and respiratory function stable Cardiovascular status: blood pressure returned to baseline and stable Postop Assessment: no apparent nausea or vomiting Anesthetic complications: no   No notable events documented.  Last Vitals:  Vitals:   03/15/21 1500 03/15/21 1515  BP: (!) 148/92   Pulse: 79 (!) 109  Resp: 13 16  Temp: 36.9 C   SpO2: 97% 93%    Last Pain:  Vitals:   03/15/21 1515  TempSrc:   PainSc: 0-No pain                 Rolla Kedzierski A.

## 2021-03-15 NOTE — Anesthesia Procedure Notes (Signed)
Procedure Name: Intubation Date/Time: 03/15/2021 1:50 PM Performed by: Bufford Spikes, CRNA Pre-anesthesia Checklist: Patient identified, Emergency Drugs available, Suction available and Patient being monitored Patient Re-evaluated:Patient Re-evaluated prior to induction Oxygen Delivery Method: Circle system utilized Preoxygenation: Pre-oxygenation with 100% oxygen Induction Type: IV induction Ventilation: Mask ventilation without difficulty Laryngoscope Size: Miller and 2 Grade View: Grade II Tube type: Oral Tube size: 7.0 mm Number of attempts: 1 Airway Equipment and Method: Stylet and Oral airway Placement Confirmation: ETT inserted through vocal cords under direct vision, positive ETCO2 and breath sounds checked- equal and bilateral Secured at: 21 cm Tube secured with: Tape Dental Injury: Teeth and Oropharynx as per pre-operative assessment

## 2021-03-15 NOTE — Transfer of Care (Signed)
Immediate Anesthesia Transfer of Care Note  Patient: Roger Vega  Procedure(s) Performed: RADIOACTIVE SEED IMPLANT/BRACHYTHERAPY IMPLANT (Prostate) CYSTOSCOPY (Urethra)  Patient Location: PACU  Anesthesia Type:General  Level of Consciousness: awake, alert , oriented and patient cooperative  Airway & Oxygen Therapy: Patient Spontanous Breathing  Post-op Assessment: Report given to RN and Post -op Vital signs reviewed and stable  Post vital signs: Reviewed and stable  Last Vitals:  Vitals Value Taken Time  BP 141/82 03/15/21 1457  Temp    Pulse 79 03/15/21 1458  Resp 13 03/15/21 1458  SpO2 97 % 03/15/21 1458  Vitals shown include unvalidated device data.  Last Pain:  Vitals:   03/15/21 1150  TempSrc: Oral  PainSc: 0-No pain      Patients Stated Pain Goal: 5 (87/56/43 [id number])  Complications: No notable events documented.

## 2021-03-15 NOTE — Op Note (Signed)
PRE-OPERATIVE DIAGNOSIS:  Adenocarcinoma of the prostate  POST-OPERATIVE DIAGNOSIS:  Same  PROCEDURE:  Procedure(s): 1. I-125 radioactive seed implantation 2. Cystoscopy  SURGEON:  Surgeon(s): Nicolette Bang, MD  Radiation oncologist: Tyler Pita, MD  ANESTHESIA:  General  EBL:  Minimal  DRAINS: none  INDICATION: Roger Vega is a 65 year old with a history of T1c prostate cancer. After discussing treatment options he has elected to proceed with brachytherapy  Description of procedure: After informed consent the patient was brought to the major OR, placed on the table and administered general anesthesia. He was then moved to the modified lithotomy position with his perineum perpendicular to the floor. His perineum and genitalia were then sterilely prepped. An official timeout was then performed. A 16 French Foley catheter was then placed in the bladder and filled with dilute contrast, a rectal tube was placed in the rectum and the transrectal ultrasound probe was placed in the rectum and affixed to the stand. He was then sterilely draped.  Real time ultrasonography was used along with the seed planning software [employer] Prostate vs. 4.2.21. This was used to develop the seed plan including the number of needles as well as number of seeds required for complete and adequate coverage. Real-time ultrasonography was then used along with the previously developed plan and the Nucletron device to implant a total of 64 seeds using 19 needles. This proceeded without difficulty or complication.    A Foley catheter was then removed as well as the transrectal ultrasound probe and rectal probe. Flexible cystoscopy was then performed using the 17 French flexible scope which revealed a normal urethra throughout its length down to the sphincter which appeared intact. The prostatic urethra revealed bilobar hypertrophy but no evidence of obstruction, seeds, spacers or lesions. The bladder was then  entered and fully and systematically inspected. The ureteral orifices were noted to be of normal configuration and position. The mucosa revealed no evidence of tumors. There were also no stones identified within the bladder. I noted no seeds or spacers on the floor of the bladder and retroflexion of the scope revealed no seeds protruding from the base of the prostate.  The cystoscope was then removed and a new 71 French Foley catheter was then inserted and the balloon was filled with 10 cc of sterile water. This was connected to closed system drainage and the patient was awakened and taken to recovery room in stable and satisfactory condition. He tolerated procedure well and there were no intraoperative complications.

## 2021-03-15 NOTE — H&P (Signed)
Urology Admission H&P  Chief Complaint: Prostate Cancer  History of Present Illness: Roger Vega is a 65yo with T1c prostate cancer here for brachytherapy. He has mild LUTS. He has mild erectile dysfunction. No other complaints today.  Past Medical History:  Diagnosis Date   Arthritis    oa   Bladder cancer ([place]) 2016   History of COVID-19    8-10 months ago per wife on 03-11-2021   Hypercholesteremia    Lumbar herniated disc 2017   ? L 4 to L 5 no surgery done   PONV (postoperative nausea and vomiting)    nausea in car on way home dizzy and feels over medicated   Prostate cancer ([place]) 2022   Renal cyst    Social Anxiety    Tinnitus    not sure which ear   Wears glasses 03/11/2021   Past Surgical History:  Procedure Laterality Date   ANKLE ARTHRODESIS     age 4-bone frag,cyst-rt   cologard  2021   was negative   COLONOSCOPY  2011   ORIF FINGER FRACTURE  06/15/2011   Procedure: OPEN REDUCTION INTERNAL FIXATION (ORIF) METACARPAL (FINGER) FRACTURE;  Surgeon: Wynonia Sours, MD;  Location: [facility];  Service: Orthopedics;  Laterality: Right;  Proximal phalanx right little finger   RETINAL DETACHMENT SURGERY Left 2016   laser in office   surgery for bladder cancer  2016    Home Medications:  Current Facility-Administered Medications  Medication Dose Route Frequency Provider Last Rate Last Admin   ceFAZolin (ANCEF) IVPB 2g/100 mL premix  2 g Intravenous Once Libero Puthoff, Candee Furbish, MD       lactated ringers infusion   Intravenous Continuous Audry Pili, MD 50 mL/hr at 03/15/21 1252 Continued from Pre-op at 03/15/21 1252   scopolamine (TRANSDERM-SCOP) 1 MG/3DAYS 1.5 mg  1 patch Transdermal Q72H Josephine Igo, MD   1.5 mg at 03/15/21 1243   [START ON 03/16/2021] sodium phosphate (FLEET) 7-19 GM/118ML enema 1 enema  1 enema Rectal Once Cleon Gustin, MD       Allergies:  Allergies  Allergen Reactions   Nsaids Anaphylaxis    Family History   Problem Relation Age of Onset   Heart disease Father    Lung cancer Other    Social History:  reports that he quit smoking about 44 years ago. His smoking use included cigarettes. He has never used smokeless tobacco. He reports that he does not drink alcohol and does not use drugs.  Review of Systems  All other systems reviewed and are negative.  Physical Exam:  Vital signs in last 24 hours: Temp:  [97.7 F (36.5 C)] 97.7 F (36.5 C) (11/21 1150) Pulse Rate:  [72] 72 (11/21 1150) Resp:  [16] 16 (11/21 1150) BP: (159)/(97) 159/97 (11/21 1150) SpO2:  [99 %] 99 % (11/21 1150) Weight:  [84.5 kg] 84.5 kg (11/21 1150) Physical Exam Vitals reviewed.  Constitutional:      Appearance: Normal appearance.  HENT:     Head: Normocephalic and atraumatic.     Nose: Nose normal. No congestion.     Mouth/Throat:     Mouth: Mucous membranes are dry.  Eyes:     Extraocular Movements: Extraocular movements intact.     Pupils: Pupils are equal, round, and reactive to light.  Cardiovascular:     Rate and Rhythm: Normal rate and regular rhythm.  Pulmonary:     Effort: Pulmonary effort is normal. No respiratory distress.  Abdominal:  General: Abdomen is flat. There is no distension.  Musculoskeletal:        General: No swelling. Normal range of motion.     Cervical back: Normal range of motion and neck supple.  Skin:    General: Skin is warm and dry.  Neurological:     General: No focal deficit present.     Mental Status: He is alert and oriented to person, place, and time.  Psychiatric:        Mood and Affect: Mood normal.        Behavior: Behavior normal.        Thought Content: Thought content normal.        Judgment: Judgment normal.    Laboratory Data:  No results found for this or any previous visit (from the past 24 hour(s)). No results found for this or any previous visit (from the past 240 hour(s)). Creatinine: Recent Labs    03/12/21 1431  CREATININE 0.83    Baseline Creatinine: 0.8  Impression/Assessment:  64yo with Prostate cancer  Plan:  The risks/benefits/alternatives to prostate brachytherapy was explained to the patient and he understands and wishes to proceed with surgery  Nicolette Bang 03/15/2021, 1:01 PM

## 2021-03-16 ENCOUNTER — Encounter (HOSPITAL_BASED_OUTPATIENT_CLINIC_OR_DEPARTMENT_OTHER): Payer: Self-pay | Admitting: Urology

## 2021-03-16 ENCOUNTER — Telehealth: Payer: Self-pay | Admitting: *Deleted

## 2021-03-16 NOTE — Telephone Encounter (Signed)
CALLED PATIENT TO INFORM OF POST SEED APPTS. FOR 04-09-21, SPOKE WITH PATIENT'S WIFE AND SHE IS AWARE OF THESE APPTS.

## 2021-03-22 ENCOUNTER — Ambulatory Visit (INDEPENDENT_AMBULATORY_CARE_PROVIDER_SITE_OTHER): Payer: BC Managed Care – PPO | Admitting: Urology

## 2021-03-22 ENCOUNTER — Other Ambulatory Visit: Payer: Self-pay

## 2021-03-22 ENCOUNTER — Encounter: Payer: Self-pay | Admitting: Urology

## 2021-03-22 VITALS — BP 144/88 | HR 72

## 2021-03-22 DIAGNOSIS — C61 Malignant neoplasm of prostate: Secondary | ICD-10-CM

## 2021-03-22 NOTE — Progress Notes (Signed)

## 2021-03-22 NOTE — Progress Notes (Signed)
03/22/2021 9:51 AM   Roger Vega February 09, 1956 [id number]  Referring provider: Curlene Labrum, MD [place],  [place] [address]  Followup after brachytherapy   HPI: Roger Vega is a 65yo here for followup after brachytherapy. He denies any significant LUTS. Urine stream is strong. No dysuria or hematuria   PMH: Past Medical History:  Diagnosis Date   Arthritis    oa   Bladder cancer ([place]) 2016   History of COVID-19    8-10 months ago per wife on 03-11-2021   Hypercholesteremia    Lumbar herniated disc 2017   ? L 4 to L 5 no surgery done   PONV (postoperative nausea and vomiting)    nausea in car on way home dizzy and feels over medicated   Prostate cancer ([place]) 2022   Renal cyst    Social Anxiety    Tinnitus    not sure which ear   Wears glasses 03/11/2021    Surgical History: Past Surgical History:  Procedure Laterality Date   ANKLE ARTHRODESIS     age 66-bone frag,cyst-rt   cologard  2021   was negative   COLONOSCOPY  2011   CYSTOSCOPY N/A 03/15/2021   Procedure: CYSTOSCOPY;  Surgeon: Cleon Gustin, MD;  Location: [facility];  Service: Urology;  Laterality: N/A;   ORIF FINGER FRACTURE  06/15/2011   Procedure: OPEN REDUCTION INTERNAL FIXATION (ORIF) METACARPAL (FINGER) FRACTURE;  Surgeon: Wynonia Sours, MD;  Location: [facility];  Service: Orthopedics;  Laterality: Right;  Proximal phalanx right little finger   RADIOACTIVE SEED IMPLANT N/A 03/15/2021   Procedure: RADIOACTIVE SEED IMPLANT/BRACHYTHERAPY IMPLANT;  Surgeon: Cleon Gustin, MD;  Location: [facility];  Service: Urology;  Laterality: N/A;   RETINAL DETACHMENT SURGERY Left 2016   laser in office   surgery for bladder cancer  2016    Home Medications:  Allergies as of 03/22/2021       Reactions   Nsaids Anaphylaxis        Medication List        Accurate as of March 22, 2021  9:51 AM. If you have any questions, ask your  nurse or doctor.          atorvastatin 40 MG tablet Commonly known as: LIPITOR Take 40 mg by mouth daily.   cyclobenzaprine 10 MG tablet Commonly known as: FLEXERIL Take 10 mg by mouth 3 (three) times daily as needed for muscle spasms.   sertraline 100 MG tablet Commonly known as: ZOLOFT Take 100 mg by mouth daily.   traMADol 50 MG tablet Commonly known as: Ultram Take 1 tablet (50 mg total) by mouth every 6 (six) hours as needed.        Allergies:  Allergies  Allergen Reactions   Nsaids Anaphylaxis    Family History: Family History  Problem Relation Age of Onset   Heart disease Father    Lung cancer Other     Social History:  reports that he quit smoking about 44 years ago. His smoking use included cigarettes. He has never used smokeless tobacco. He reports that he does not drink alcohol and does not use drugs.  ROS: All other review of systems were reviewed and are negative except what is noted above in HPI  Physical Exam: BP (!) 144/88   Pulse 72   Constitutional:  Alert and oriented, No acute distress. HEENT: [place] AT, moist mucus membranes.  Trachea midline, no masses. Cardiovascular: No  clubbing, cyanosis, or edema. Respiratory: Normal respiratory effort, no increased work of breathing. GI: Abdomen is soft, nontender, nondistended, no abdominal masses GU: No CVA tenderness.  Lymph: No cervical or inguinal lymphadenopathy. Skin: No rashes, bruises or suspicious lesions. Neurologic: Grossly intact, no focal deficits, moving all 4 extremities. Psychiatric: Normal mood and affect.  Laboratory Data: Lab Results  Component Value Date   WBC 5.6 03/12/2021   HGB 15.1 03/12/2021   HCT 45.1 03/12/2021   MCV 85.7 03/12/2021   PLT 276 03/12/2021    Lab Results  Component Value Date   CREATININE 0.83 03/12/2021    Lab Results  Component Value Date   PSA 6.7 (H) 08/27/2019    No results found for: TESTOSTERONE  No results found for:  HGBA1C  Urinalysis    Component Value Date/Time   APPEARANCEUR Clear 01/20/2021 1635   GLUCOSEU Negative 01/20/2021 1635   BILIRUBINUR Negative 01/20/2021 1635   PROTEINUR Negative 01/20/2021 1635   UROBILINOGEN negative (A) 08/27/2019 1102   NITRITE Negative 01/20/2021 1635   LEUKOCYTESUR Negative 01/20/2021 1635    Lab Results  Component Value Date   LABMICR Comment 01/20/2021    Pertinent Imaging:  No results found for this or any previous visit.  No results found for this or any previous visit.  No results found for this or any previous visit.  No results found for this or any previous visit.  No results found for this or any previous visit.  No results found for this or any previous visit.  No results found for this or any previous visit.  No results found for this or any previous visit.   Assessment & Plan:    1. Prostate cancer ([place]) - RTC 3 months with PSA   Return in about 3 months (around 06/22/2021) for PSA.  Nicolette Bang, MD  [facility] [place]

## 2021-03-22 NOTE — Patient Instructions (Signed)
Prostate Cancer The prostate is a small gland that produces fluid that makes up semen (seminal fluid). It is located below the bladder in men, in front of the rectum. Prostate cancer is the abnormal growth of cells in the prostate gland. What are the causes? The exact cause of this condition is not known. What increases the risk? You are more likely to develop this condition if: You are 63 years of age or older. You have a family history of prostate cancer. You have a family history of breast and ovarian cancer. You have genes that are passed from parent to child (inherited), such as BRCA1 and BRCA2. You have Lynch syndrome. African American men and men of African descent are diagnosed with prostate cancer at higher rates than other men. The reasons for this are not well understood and are likely due to a combination of genetic and environmental factors. What are the signs or symptoms? Symptoms of this condition include: Problems with urination. This may include: A weak or interrupted flow of urine. Trouble starting or stopping urination. Trouble emptying the bladder all the way. The need to urinate more often, especially at night. Blood in urine or semen. Persistent pain or discomfort in the lower back, lower abdomen, or hips. Trouble getting an erection. Weakness or numbness in the legs or feet. How is this diagnosed? This condition can be diagnosed with: A digital rectal exam. For this exam, a health care provider inserts a gloved finger into the rectum to feel the prostate gland. A blood test called a prostate-specific antigen (PSA) test. A procedure in which a sample of tissue is taken from the prostate and checked under a microscope (prostate biopsy). An imaging test called transrectal ultrasonography. Once the condition is diagnosed, tests will be done to determine how far the cancer has spread. This is called staging the cancer. Staging may involve imaging tests, such as a bone  scan, CT scan, PET scan, or MRI. Stages of prostate cancer The stages of prostate cancer are as follows: Stage 1 (I). At this stage, the cancer is found in the prostate only. The cancer is not visible on imaging tests, and it is usually found by accident, such as during prostate surgery. Stage 2 (II). At this stage, the cancer is more advanced than it is in stage 1, but the cancer has not spread outside the prostate. Stage 3 (III). At this stage, the cancer has spread beyond the outer layer of the prostate to nearby tissues. The cancer may be found in the seminal vesicles, which are near the bladder and the prostate. Stage 4 (IV). At this stage, the cancer has spread to other parts of the body, such as the lymph nodes, bones, bladder, rectum, liver, or lungs. Prostate cancer grading Prostate cancer is also graded according to how the cancer cells look under a microscope. This is called the Gleason score and the total score can range from 6-10, indicating how likely it is that the cancer will spread (metastasize) to other parts of the body. The higher the score, the greater the likelihood that the cancer will spread. Gleason 6 or lower: This indicates that the cancer cells look similar to normal prostate cells (well differentiated). Gleason 7: This indicates that the cancer cells look somewhat similar to normal prostate cells (moderately differentiated). Gleason 8, 9, or 10: This indicates that the cancer cells look very different than normal prostate cells (poorly differentiated). How is this treated? Treatment for this condition depends on several factors,  including the stage of the cancer, your age, personal preferences, and your overall health. Talk with your health care provider about treatment options that are recommended for you. Common treatments include: Observation for early stage prostate cancer (active surveillance). This involves having exams, blood tests, and in some cases, more biopsies.  For some men, this is the only treatment needed. Surgery. Types of surgeries include: Open surgery (radical prostatectomy). In this surgery, a larger incision is made to remove the prostate. A laparoscopic radical prostatectomy. This is a surgery to remove the prostate and lymph nodes through several small incisions. It is often referred to as a minimally invasive surgery. A robotic radical prostatectomy. This is laparoscopic surgery to remove the prostate and lymph nodes with the help of robotic arms that are controlled by the surgeon. Cryoablation. This is surgery to freeze and destroy cancer cells. Radiation treatment. Types of radiation treatment include: External beam radiation. This type aims beams of radiation from outside the body at the prostate to destroy cancerous cells. Brachytherapy. This type uses radioactive needles, seeds, wires, or tubes that are implanted into the prostate gland. Like external beam radiation, brachytherapy destroys cancerous cells. An advantage is that this type of radiation limits the damage to surrounding tissue and has fewer side effects. Chemotherapy. This treatment kills cancer cells or stops them from multiplying. It kills both cancer cells and normal cells. Targeted therapy. This treatment uses medicines to kill cancer cells without damaging normal cells. Hormone treatment. This treatment involves taking medicines that act on testosterone, one of the male hormones, by: Stopping your body from producing testosterone. Blocking testosterone from reaching cancer cells. Follow these instructions at home: Lifestyle Do not use any products that contain nicotine or tobacco. These products include cigarettes, chewing tobacco, and vaping devices, such as e-cigarettes. If you need help quitting, ask your health care provider. Eat a healthy diet. To do this: Eat foods that are high in fiber. These include beans, whole grains, and fresh fruits and vegetables. Limit  foods that are high in fat and sugar. These include fried or sweet foods. Treatment for prostate cancer may affect sexual function. If you have a partner, continue to have intimate moments. This may include touching, holding, hugging, and caressing your partner. Get plenty of sleep. Consider joining a support group for men who have prostate cancer. Meeting with a support group may help you learn to manage the stress of having cancer. General instructions Take over-the-counter and prescription medicines only as told by your health care provider. If you have to go to the hospital, notify your cancer specialist (oncologist). Keep all follow-up visits. This is important. Where to find more information American Cancer Society: www.cancer.org American Society of Clinical Oncology: www.cancer.net National Cancer Institute: www.cancer.gov Contact a health care provider if: You have new or increasing trouble urinating. You have new or increasing blood in your urine. You have new or increasing pain in your hips, back, or chest. Get help right away if: You have weakness or numbness in your legs. You cannot control urination or your bowel movements (incontinence). You have chills or a fever. Summary The prostate is a small gland that is involved in the production of semen. It is located below a man's bladder, in front of the rectum. Prostate cancer is the abnormal growth of cells in the prostate gland. Treatment for this condition depends on the stage of the cancer, your age, personal preferences, and your overall health. Talk with your health care provider about treatment   options that are recommended for you. Consider joining a support group for men who have prostate cancer. Meeting with a support group may help you learn to manage the stress of having cancer. This information is not intended to replace advice given to you by your health care provider. Make sure you discuss any questions you have with  your health care provider. Document Revised: 07/08/2020 Document Reviewed: 07/08/2020 Elsevier Patient Education  2022 [employer] Inc.  

## 2021-04-08 ENCOUNTER — Telehealth: Payer: Self-pay | Admitting: *Deleted

## 2021-04-08 NOTE — Telephone Encounter (Signed)
CALLED PATIENT TO REMIND OF POST SEED APPTS. FOR 04-09-21- ARRIVAL TIME- 9:45 AM @ [facility], SPOKE WITH PATIENT'S WIFE- BETTY AND SHE IS AWARE OF THESE APPTS.

## 2021-04-08 NOTE — Progress Notes (Signed)
Radiation Oncology         [phone] ________________________________  Name: Nicolus Ose MRN: [id number]  Date: 04/09/2021  DOB: [birth year]  Post-Seed Follow-Up Visit Note  CC: Curlene Labrum, MD  Cleon Gustin, MD  Diagnosis:   65 y.o. gentleman with Stage T1c adenocarcinoma of the prostate with Gleason Score of 4+3, and PSA of 12.7.    ICD-10-CM   1. Prostate cancer ([place])  C61       Interval Since Last Radiation:  1 months 03/15/21:  Insertion of radioactive I-125 seeds into the prostate gland; 145 Gy, definitive therapy without placement of SpaceOAR gel- insurance denied.  Narrative:  The patient returns today for routine follow-up.  He is complaining of increased urinary frequency and urinary hesitation symptoms. He filled out a questionnaire regarding urinary function today providing and overall IPSS score of 25 characterizing his symptoms as severe with hesitancy, weak stream, intermittency, frequency, urgency and feelings of incomplete bladder emptying.  His pre-implant score was 1. He denies any abdominal pain or bowel symptoms.  He reports a healthy appetite and is maintaining his weight.  He has not noticed any significant impact on his energy level and overall, is quite pleased with his progress to date.  ALLERGIES:  is allergic to nsaids.  Meds: Current Outpatient Medications  Medication Sig Dispense Refill   atorvastatin (LIPITOR) 40 MG tablet Take 40 mg by mouth daily.     cyclobenzaprine (FLEXERIL) 10 MG tablet Take 10 mg by mouth 3 (three) times daily as needed for muscle spasms.     sertraline (ZOLOFT) 100 MG tablet Take 100 mg by mouth daily.     traMADol (ULTRAM) 50 MG tablet Take 1 tablet (50 mg total) by mouth every 6 (six) hours as needed. 15 tablet 0   No current facility-administered medications for this visit.    Physical Findings: In general this is a well appearing Caucasian male in no acute distress. He's alert and oriented x4 and  appropriate throughout the examination. Cardiopulmonary assessment is negative for acute distress and he exhibits normal effort.   Lab Findings: Lab Results  Component Value Date   WBC 5.6 03/12/2021   HGB 15.1 03/12/2021   HCT 45.1 03/12/2021   MCV 85.7 03/12/2021   PLT 276 03/12/2021    Radiographic Findings:  Patient underwent CT imaging in our clinic for post implant dosimetry. The CT will be reviewed by Dr. Tammi Klippel to confirm there is an adequate distribution of radioactive seeds throughout the prostate gland and ensure that there are no seeds in or near the rectum.  We suspect the final radiation plan and dosimetry will show appropriate coverage of the prostate gland. He understands that we will call and inform him of any unexpected findings on further review of his imaging and dosimetry.  Impression/Plan: 65 y.o. gentleman with Stage T1c adenocarcinoma of the prostate with Gleason Score of 4+3, and PSA of 12.7. The patient is recovering from the effects of radiation. His urinary symptoms should gradually improve over the next 4-6 months. We talked about this today and he is interested in trying a trial of alpha-blocker so I have sent a prescription for Flomax to his pharmacy. He is encouraged by his improvement already and is otherwise pleased with his outcome. We also talked about long-term follow-up for prostate cancer following seed implant. He understands that ongoing PSA determinations and digital rectal exams will help perform surveillance to rule out disease recurrence. He saw Dr. Alyson Ingles on 03/22/21  and his next follow up appointment scheduled with Dr. Alyson Ingles is on 06/22/20. He understands what to expect with his PSA measures. Patient was also educated today about some of the long-term effects from radiation including a small risk for rectal bleeding and possibly erectile dysfunction. We talked about some of the general management approaches to these potential complications. However,  I did encourage the patient to contact our office or return at any point if he has questions or concerns related to his previous radiation and prostate cancer.    Nicholos Johns, PA-C

## 2021-04-08 NOTE — Progress Notes (Signed)
°  Radiation Oncology         [phone] ________________________________  Name: Roger Vega MRN: [id number]  Date: 04/09/2021  DOB: May 26, 1955  COMPLEX SIMULATION NOTE  NARRATIVE:  The patient was brought to the [facility] today following prostate seed implantation approximately one month ago.  Identity was confirmed.  All relevant records and images related to the planned course of therapy were reviewed.  Then, the patient was set-up supine.  CT images were obtained.  The CT images were loaded into the planning software.  Then the prostate and rectum were contoured.  Treatment planning then occurred.  The implanted iodine 125 seeds were identified by the physics staff for projection of radiation distribution  I have requested : 3D Simulation  I have requested a DVH of the following structures: Prostate and rectum.    ________________________________  Sheral Apley Tammi Klippel, M.D.

## 2021-04-09 ENCOUNTER — Other Ambulatory Visit: Payer: Self-pay

## 2021-04-09 ENCOUNTER — Ambulatory Visit
Admission: RE | Admit: 2021-04-09 | Discharge: 2021-04-09 | Disposition: A | Discharge status: Home / Self Care | Payer: BC Managed Care – PPO | Source: Ambulatory Visit | Attending: Radiation Oncology | Admitting: Radiation Oncology

## 2021-04-09 ENCOUNTER — Encounter: Payer: Self-pay | Admitting: Urology

## 2021-04-09 ENCOUNTER — Ambulatory Visit
Admission: RE | Admit: 2021-04-09 | Discharge: 2021-04-09 | Disposition: A | Discharge status: Home / Self Care | Payer: BC Managed Care – PPO | Source: Ambulatory Visit | Attending: Urology | Admitting: Urology

## 2021-04-09 VITALS — BP 151/84 | HR 73 | Temp 98.2°F | Resp 20 | Ht 71.0 in | Wt 188.0 lb

## 2021-04-09 DIAGNOSIS — C61 Malignant neoplasm of prostate: Secondary | ICD-10-CM | POA: Insufficient documentation

## 2021-04-09 MED ORDER — TAMSULOSIN HCL 0.4 MG PO CAPS
0.4000 mg | ORAL_CAPSULE | Freq: Every day | ORAL | 5 refills | Status: DC
Start: 2021-04-09 — End: 2021-06-22

## 2021-04-09 NOTE — Progress Notes (Signed)
Patient reports polyuria. No other symptoms reported at this time. Meaningful use complete.  I-PSS score of 25 (severe).  No urinary management medications taken at this time. Urology follow-up scheduled for February 2023 w/ [facility] -per patient.  BP (!) 151/84 (BP Location: Left Arm, Patient Position: Sitting, Cuff Size: Normal)    Pulse 73    Temp 98.2 F (36.8 C) (Temporal)    Resp 20    Ht 5\' 11"  (1.803 m)    Wt 188 lb (85.3 kg)    SpO2 100%    BMI 26.22 kg/m

## 2021-04-14 ENCOUNTER — Encounter: Payer: Self-pay | Admitting: Radiation Oncology

## 2021-04-14 DIAGNOSIS — C61 Malignant neoplasm of prostate: Secondary | ICD-10-CM | POA: Diagnosis not present

## 2021-04-26 NOTE — Progress Notes (Signed)
°  Radiation Oncology         [phone] ________________________________  Name: Marvon Shillingburg MRN: [id number]  Date: 04/14/2021  DOB: 22-Jan-1956  3D Planning Note   Prostate Brachytherapy Post-Implant Dosimetry  Diagnosis: 66 y.o. gentleman with Stage T1c adenocarcinoma of the prostate with Gleason Score of 4+3, and PSA of 12.7.  Narrative: On a previous date, Tres Grzywacz returned following prostate seed implantation for post implant planning. He underwent CT scan complex simulation to delineate the three-dimensional structures of the pelvis and demonstrate the radiation distribution.  Since that time, the seed localization, and complex isodose planning with dose volume histograms have now been completed.  Results:   Prostate Coverage - The dose of radiation delivered to the 90% or more of the prostate gland (D90) was 101.51% of the prescription dose. This exceeds our goal of greater than 90%. Rectal Sparing - The volume of rectal tissue receiving the prescription dose or higher was 0.5 cc. This falls under our thresholds tolerance of 1.0 cc.  Impression: The prostate seed implant appears to show adequate target coverage and appropriate rectal sparing.  Plan:  The patient will continue to follow with urology for ongoing PSA determinations. I would anticipate a high likelihood for local tumor control with minimal risk for rectal morbidity.  ________________________________  Sheral Apley Tammi Klippel, M.D.

## 2021-05-20 ENCOUNTER — Telehealth: Payer: Self-pay | Admitting: *Deleted

## 2021-05-21 ENCOUNTER — Telehealth: Payer: Self-pay | Admitting: *Deleted

## 2021-06-15 ENCOUNTER — Other Ambulatory Visit: Payer: Self-pay

## 2021-06-15 ENCOUNTER — Inpatient Hospital Stay: Payer: BC Managed Care – PPO | Attending: Adult Health | Admitting: *Deleted

## 2021-06-15 ENCOUNTER — Other Ambulatory Visit: Payer: BC Managed Care – PPO

## 2021-06-15 ENCOUNTER — Encounter: Payer: Self-pay | Admitting: *Deleted

## 2021-06-15 DIAGNOSIS — C61 Malignant neoplasm of prostate: Secondary | ICD-10-CM

## 2021-06-15 NOTE — Progress Notes (Signed)
2 Identifiers were used for verification purposes only. No vital signs were taken as this was a telephone visit. Allergy and medication list reviewed.Pt denies pain today.  Pt says he's energy level is getting better everyday. Pt will see urologist next week to review PSA determinations. He recently saw PCP. Pt still has some urinary urgency and frequency mainly during the daytime hours but he does drink a good amount of coffee during these hours. He sleeps well only getting up to bathroom twice a night. Pt does not regularly exercise. I explained to him the benefits of exercise in cancer prevention and encouraged him to at least do 150 mins/week for the minimum. Last colonoscopy 2011 and cologuard 2022. Results were negative for blood. Pt has not received flu vaccine for the year. He states, he doesn't take the flu shot. SCP reviewed and completed.

## 2021-06-16 LAB — PSA: Prostate Specific Ag, Serum: 4.8 ng/mL — ABNORMAL HIGH (ref 0.0–4.0)

## 2021-06-22 ENCOUNTER — Ambulatory Visit: Payer: BC Managed Care – PPO | Admitting: Urology

## 2021-06-22 ENCOUNTER — Encounter: Payer: Self-pay | Admitting: Urology

## 2021-06-22 ENCOUNTER — Other Ambulatory Visit: Payer: Self-pay

## 2021-06-22 VITALS — BP 145/80 | HR 76

## 2021-06-22 DIAGNOSIS — C61 Malignant neoplasm of prostate: Secondary | ICD-10-CM | POA: Diagnosis not present

## 2021-06-22 DIAGNOSIS — R972 Elevated prostate specific antigen [PSA]: Secondary | ICD-10-CM

## 2021-06-22 LAB — URINALYSIS, ROUTINE W REFLEX MICROSCOPIC
Bilirubin, UA: NEGATIVE
Glucose, UA: NEGATIVE
Ketones, UA: NEGATIVE
Leukocytes,UA: NEGATIVE
Nitrite, UA: NEGATIVE
Protein,UA: NEGATIVE
RBC, UA: NEGATIVE
Specific Gravity, UA: 1.015 (ref 1.005–1.030)
Urobilinogen, Ur: 0.2 mg/dL (ref 0.2–1.0)
pH, UA: 7 (ref 5.0–7.5)

## 2021-06-22 MED ORDER — TAMSULOSIN HCL 0.4 MG PO CAPS: 0.4000 mg | ORAL_CAPSULE | Freq: Two times a day (BID) | ORAL | 11 refills | Status: DC

## 2021-06-22 NOTE — Patient Instructions (Signed)

## 2021-06-22 NOTE — Progress Notes (Signed)
post void residual=105

## 2021-06-22 NOTE — Progress Notes (Signed)
06/22/2021 9:26 AM   [name] May 28, 1955 [id number]  Referring provider: Curlene Labrum, MD [place],  [place] [address]  Followup prostate cancer and BPH   HPI: Roger Vega is a 66yo here for followup for prostate cancer and BPH. IPSS 20 QOL 4 on flomax 0.4mg  daily. Urine stream weak. Nocturia 2x. He has straining to urinate. PSA decreased to 4.8 from 12.7. No other complaints today   PMH: Past Medical History:  Diagnosis Date   Arthritis    oa   Bladder cancer ([place]) 2016   History of COVID-19    8-10 months ago per wife on 03-11-2021   Hypercholesteremia    Lumbar herniated disc 2017   ? L 4 to L 5 no surgery done   PONV (postoperative nausea and vomiting)    nausea in car on way home dizzy and feels over medicated   Prostate cancer ([place]) 2022   Renal cyst    Social Anxiety    Tinnitus    not sure which ear   Wears glasses 03/11/2021    Surgical History: Past Surgical History:  Procedure Laterality Date   ANKLE ARTHRODESIS     age 76-bone frag,cyst-rt   cologard  2021   was negative   COLONOSCOPY  2011   CYSTOSCOPY N/A 03/15/2021   Procedure: CYSTOSCOPY;  Surgeon: Cleon Gustin, MD;  Location: [facility];  Service: Urology;  Laterality: N/A;   ORIF FINGER FRACTURE  06/15/2011   Procedure: OPEN REDUCTION INTERNAL FIXATION (ORIF) METACARPAL (FINGER) FRACTURE;  Surgeon: Wynonia Sours, MD;  Location: [facility];  Service: Orthopedics;  Laterality: Right;  Proximal phalanx right little finger   RADIOACTIVE SEED IMPLANT N/A 03/15/2021   Procedure: RADIOACTIVE SEED IMPLANT/BRACHYTHERAPY IMPLANT;  Surgeon: Cleon Gustin, MD;  Location: [facility];  Service: Urology;  Laterality: N/A;   RETINAL DETACHMENT SURGERY Left 2016   laser in office   surgery for bladder cancer  2016    Home Medications:  Allergies as of 06/22/2021       Reactions   Nsaids Anaphylaxis        Medication List         Accurate as of June 22, 2021  9:26 AM. If you have any questions, ask your nurse or doctor.          atorvastatin 40 MG tablet Commonly known as: LIPITOR Take 40 mg by mouth daily.   cyclobenzaprine 10 MG tablet Commonly known as: FLEXERIL Take 10 mg by mouth 3 (three) times daily as needed for muscle spasms.   sertraline 100 MG tablet Commonly known as: ZOLOFT Take 100 mg by mouth daily.   tamsulosin 0.4 MG Caps capsule Commonly known as: FLOMAX Take 1 capsule (0.4 mg total) by mouth daily after supper.   traMADol 50 MG tablet Commonly known as: Ultram Take 1 tablet (50 mg total) by mouth every 6 (six) hours as needed.        Allergies:  Allergies  Allergen Reactions   Nsaids Anaphylaxis    Family History: Family History  Problem Relation Age of Onset   Heart disease Father    Lung cancer Other     Social History:  reports that he quit smoking about 45 years ago. His smoking use included cigarettes. He has never used smokeless tobacco. He reports that he does not drink alcohol and does not use drugs.  ROS: All other review of systems were reviewed and  are negative except what is noted above in HPI  Physical Exam: BP (!) 145/80    Pulse 76   Constitutional:  Alert and oriented, No acute distress. HEENT: [place] AT, moist mucus membranes.  Trachea midline, no masses. Cardiovascular: No clubbing, cyanosis, or edema. Respiratory: Normal respiratory effort, no increased work of breathing. GI: Abdomen is soft, nontender, nondistended, no abdominal masses GU: No CVA tenderness.  Lymph: No cervical or inguinal lymphadenopathy. Skin: No rashes, bruises or suspicious lesions. Neurologic: Grossly intact, no focal deficits, moving all 4 extremities. Psychiatric: Normal mood and affect.  Laboratory Data: Lab Results  Component Value Date   WBC 5.6 03/12/2021   HGB 15.1 03/12/2021   HCT 45.1 03/12/2021   MCV 85.7 03/12/2021   PLT 276 03/12/2021    Lab  Results  Component Value Date   CREATININE 0.83 03/12/2021    Lab Results  Component Value Date   PSA 6.7 (H) 08/27/2019    No results found for: TESTOSTERONE  No results found for: HGBA1C  Urinalysis    Component Value Date/Time   APPEARANCEUR Clear 01/20/2021 1635   GLUCOSEU Negative 01/20/2021 1635   BILIRUBINUR Negative 01/20/2021 1635   PROTEINUR Negative 01/20/2021 1635   UROBILINOGEN negative (A) 08/27/2019 1102   NITRITE Negative 01/20/2021 1635   LEUKOCYTESUR Negative 01/20/2021 1635    Lab Results  Component Value Date   LABMICR Comment 01/20/2021    Pertinent Imaging:  No results found for this or any previous visit.  No results found for this or any previous visit.  No results found for this or any previous visit.  No results found for this or any previous visit.  No results found for this or any previous visit.  No results found for this or any previous visit.  No results found for this or any previous visit.  No results found for this or any previous visit.   Assessment & Plan:    1. BPH with weak urinary stream -Increase flomax to 0.4mg  BID - Urinalysis, Routine w reflex microscopic - BLADDER SCAN AMB NON-IMAGING  2. Prostate cancer ([place]) -RTC 3 months with PSA   No follow-ups on file.  Nicolette Bang, MD  [facility] [place]

## 2021-08-24 ENCOUNTER — Telehealth: Payer: Self-pay

## 2021-08-24 NOTE — Telephone Encounter (Signed)
Patient called to inform you he is still having trouble voiding, you had mentioned sending him a stronger rx.  He is asking if you can send it to his pharmacy.  ?

## 2021-08-27 ENCOUNTER — Other Ambulatory Visit: Payer: Self-pay

## 2021-08-27 DIAGNOSIS — C61 Malignant neoplasm of prostate: Secondary | ICD-10-CM

## 2021-08-27 MED ORDER — SILODOSIN 8 MG PO CAPS: 8.0000 mg | ORAL_CAPSULE | Freq: Every day | ORAL | 11 refills | Status: DC

## 2021-08-27 NOTE — Telephone Encounter (Signed)
Dr. Alyson Ingles gave verbal orders to send in Rapaflo '8mg'$  to pharmacy.  Sent rx to pharmacy and notified patient through portal. ?

## 2021-09-17 ENCOUNTER — Other Ambulatory Visit: Payer: BC Managed Care – PPO

## 2021-09-17 DIAGNOSIS — C61 Malignant neoplasm of prostate: Secondary | ICD-10-CM

## 2021-09-18 LAB — PSA: Prostate Specific Ag, Serum: 2.6 ng/mL (ref 0.0–4.0)

## 2021-09-29 ENCOUNTER — Encounter: Payer: Self-pay | Admitting: Urology

## 2021-09-29 ENCOUNTER — Ambulatory Visit: Payer: BC Managed Care – PPO | Admitting: Urology

## 2021-09-29 VITALS — BP 140/87 | HR 72 | Ht 71.0 in | Wt 185.0 lb

## 2021-09-29 DIAGNOSIS — C61 Malignant neoplasm of prostate: Secondary | ICD-10-CM

## 2021-09-29 DIAGNOSIS — Z8546 Personal history of malignant neoplasm of prostate: Secondary | ICD-10-CM

## 2021-09-29 DIAGNOSIS — R3915 Urgency of urination: Secondary | ICD-10-CM | POA: Diagnosis not present

## 2021-09-29 LAB — URINALYSIS, ROUTINE W REFLEX MICROSCOPIC
Bilirubin, UA: NEGATIVE
Glucose, UA: NEGATIVE
Ketones, UA: NEGATIVE
Leukocytes,UA: NEGATIVE
Nitrite, UA: NEGATIVE
Protein,UA: NEGATIVE
RBC, UA: NEGATIVE
Specific Gravity, UA: 1.015 (ref 1.005–1.030)
Urobilinogen, Ur: 1 mg/dL (ref 0.2–1.0)
pH, UA: 7 (ref 5.0–7.5)

## 2021-09-29 MED ORDER — MIRABEGRON ER 25 MG PO TB24: 25.0000 mg | ORAL_TABLET | Freq: Every day | ORAL | 0 refills | Status: DC

## 2021-09-29 NOTE — Progress Notes (Signed)
09/29/2021 3:56 PM   Roger Vega 1955-08-19 [id number]  Referring provider: Curlene Labrum, MD [place],  [place] [address]  Followup prostate cancer   HPI: Roger Vega is a 66yo here for followup for prostate cancer. PSA decreased to 2.6 from 4.8. He underwent brachytherapy in 02/2021. IPSS 16 QOL 3 on flomax 0.'4mg'$  daily. UA normal. He has urinary urgency which is bothersome to him. He has small volume voids with the urgency.    PMH: Past Medical History:  Diagnosis Date   Arthritis    oa   Bladder cancer ([place]) 2016   History of COVID-19    8-10 months ago per wife on 03-11-2021   Hypercholesteremia    Lumbar herniated disc 2017   ? L 4 to L 5 no surgery done   PONV (postoperative nausea and vomiting)    nausea in car on way home dizzy and feels over medicated   Prostate cancer ([place]) 2022   Renal cyst    Social Anxiety    Tinnitus    not sure which ear   Wears glasses 03/11/2021    Surgical History: Past Surgical History:  Procedure Laterality Date   ANKLE ARTHRODESIS     age 78-bone frag,cyst-rt   cologard  2021   was negative   COLONOSCOPY  2011   CYSTOSCOPY N/A 03/15/2021   Procedure: CYSTOSCOPY;  Surgeon: Cleon Gustin, MD;  Location: [facility];  Service: Urology;  Laterality: N/A;   ORIF FINGER FRACTURE  06/15/2011   Procedure: OPEN REDUCTION INTERNAL FIXATION (ORIF) METACARPAL (FINGER) FRACTURE;  Surgeon: Wynonia Sours, MD;  Location: [facility];  Service: Orthopedics;  Laterality: Right;  Proximal phalanx right little finger   RADIOACTIVE SEED IMPLANT N/A 03/15/2021   Procedure: RADIOACTIVE SEED IMPLANT/BRACHYTHERAPY IMPLANT;  Surgeon: Cleon Gustin, MD;  Location: [facility];  Service: Urology;  Laterality: N/A;   RETINAL DETACHMENT SURGERY Left 2016   laser in office   surgery for bladder cancer  2016    Home Medications:  Allergies as of 09/29/2021       Reactions   Nsaids  Anaphylaxis        Medication List        Accurate as of September 29, 2021  3:56 PM. If you have any questions, ask your nurse or doctor.          atorvastatin 40 MG tablet Commonly known as: LIPITOR Take 40 mg by mouth daily.   cyclobenzaprine 10 MG tablet Commonly known as: FLEXERIL Take 10 mg by mouth 3 (three) times daily as needed for muscle spasms.   sertraline 100 MG tablet Commonly known as: ZOLOFT Take 100 mg by mouth daily.   silodosin 8 MG Caps capsule Commonly known as: RAPAFLO Take 1 capsule (8 mg total) by mouth daily with breakfast.   tamsulosin 0.4 MG Caps capsule Commonly known as: FLOMAX Take 1 capsule (0.4 mg total) by mouth in the morning and at bedtime.   traMADol 50 MG tablet Commonly known as: Ultram Take 1 tablet (50 mg total) by mouth every 6 (six) hours as needed.        Allergies:  Allergies  Allergen Reactions   Nsaids Anaphylaxis    Family History: Family History  Problem Relation Age of Onset   Heart disease Father    Lung cancer Other     Social History:  reports that he quit smoking about 45 years ago. His smoking  use included cigarettes. He has never used smokeless tobacco. He reports that he does not drink alcohol and does not use drugs.  ROS: All other review of systems were reviewed and are negative except what is noted above in HPI  Physical Exam: BP 140/87   Pulse 72   Ht '5\' 11"'$  (1.803 m)   Wt 185 lb (83.9 kg)   BMI 25.80 kg/m   Constitutional:  Alert and oriented, No acute distress. HEENT: [place] AT, moist mucus membranes.  Trachea midline, no masses. Cardiovascular: No clubbing, cyanosis, or edema. Respiratory: Normal respiratory effort, no increased work of breathing. GI: Abdomen is soft, nontender, nondistended, no abdominal masses GU: No CVA tenderness.  Lymph: No cervical or inguinal lymphadenopathy. Skin: No rashes, bruises or suspicious lesions. Neurologic: Grossly intact, no focal deficits, moving all 4  extremities. Psychiatric: Normal mood and affect.  Laboratory Data: Lab Results  Component Value Date   WBC 5.6 03/12/2021   HGB 15.1 03/12/2021   HCT 45.1 03/12/2021   MCV 85.7 03/12/2021   PLT 276 03/12/2021    Lab Results  Component Value Date   CREATININE 0.83 03/12/2021    Lab Results  Component Value Date   PSA 6.7 (H) 08/27/2019    No results found for: TESTOSTERONE  No results found for: HGBA1C  Urinalysis    Component Value Date/Time   APPEARANCEUR Clear 06/22/2021 1008   GLUCOSEU Negative 06/22/2021 1008   BILIRUBINUR Negative 06/22/2021 1008   PROTEINUR Negative 06/22/2021 1008   UROBILINOGEN negative (A) 08/27/2019 1102   NITRITE Negative 06/22/2021 1008   LEUKOCYTESUR Negative 06/22/2021 1008    Lab Results  Component Value Date   LABMICR Comment 06/22/2021    Pertinent Imaging:  No results found for this or any previous visit.  No results found for this or any previous visit.  No results found for this or any previous visit.  No results found for this or any previous visit.  No results found for this or any previous visit.  No results found for this or any previous visit.  No results found for this or any previous visit.  No results found for this or any previous visit.   Assessment & Plan:    1. Prostate cancer ([place]) -RTC 3 months with PSA - Urinalysis, Routine w reflex microscopic  2. Urinary urgency -We will trial mirabegron '25mg'$  daily. He will continue his flomax 0.'4mg'$  daily  No follow-ups on file.  Nicolette Bang, MD  [facility] [place]

## 2021-09-29 NOTE — Patient Instructions (Signed)

## 2021-10-29 ENCOUNTER — Telehealth: Payer: Self-pay

## 2021-10-29 NOTE — Telephone Encounter (Signed)
Myrbetriq samples are working well for patient. He would like a rx but for a generic or alternate med because he knows his insurance will not cover it.

## 2021-11-02 NOTE — Telephone Encounter (Signed)
1 month samples provided.  Patient informed and will pick up at front desk.

## 2021-12-28 ENCOUNTER — Other Ambulatory Visit: Payer: BC Managed Care – PPO

## 2021-12-28 DIAGNOSIS — C61 Malignant neoplasm of prostate: Secondary | ICD-10-CM

## 2021-12-30 LAB — PSA: Prostate Specific Ag, Serum: 2.4 ng/mL (ref 0.0–4.0)

## 2022-01-03 ENCOUNTER — Encounter: Payer: Self-pay | Admitting: Urology

## 2022-01-03 ENCOUNTER — Ambulatory Visit: Payer: BC Managed Care – PPO | Admitting: Urology

## 2022-01-03 VITALS — BP 137/87 | HR 65

## 2022-01-03 DIAGNOSIS — R3915 Urgency of urination: Secondary | ICD-10-CM

## 2022-01-03 DIAGNOSIS — N401 Enlarged prostate with lower urinary tract symptoms: Secondary | ICD-10-CM | POA: Diagnosis not present

## 2022-01-03 DIAGNOSIS — N138 Other obstructive and reflux uropathy: Secondary | ICD-10-CM

## 2022-01-03 DIAGNOSIS — C61 Malignant neoplasm of prostate: Secondary | ICD-10-CM

## 2022-01-03 LAB — URINALYSIS, ROUTINE W REFLEX MICROSCOPIC
Bilirubin, UA: NEGATIVE
Glucose, UA: NEGATIVE
Ketones, UA: NEGATIVE
Leukocytes,UA: NEGATIVE
Nitrite, UA: NEGATIVE
Protein,UA: NEGATIVE
RBC, UA: NEGATIVE
Specific Gravity, UA: 1.01 (ref 1.005–1.030)
Urobilinogen, Ur: 1 mg/dL (ref 0.2–1.0)
pH, UA: 7 (ref 5.0–7.5)

## 2022-01-03 MED ORDER — TAMSULOSIN HCL 0.4 MG PO CAPS
0.4000 mg | ORAL_CAPSULE | Freq: Two times a day (BID) | ORAL | 11 refills | Status: AC
Start: 2022-01-03 — End: ?

## 2022-01-03 NOTE — Progress Notes (Signed)
01/03/2022 4:14 PM   Roger Vega [birth year] [id number]  Referring provider: Curlene Labrum, MD [place],  [place] [address]  Followup prostate cancer and BPH   HPI: Mr Postle is a 67yo here for followup for prostate cancer and BPH. PSA decreased to 2.4. IPSS 14 QOl 1 on flomax BID. Urine stream strong. Urinary frequency every 90-120 minutes. He has urgency once peer day but no urge incontinence. NO other complaints today   PMH: Past Medical History:  Diagnosis Date   Arthritis    oa   Bladder cancer ([place]) 2016   History of COVID-19    8-10 months ago per wife on 03-11-2021   Hypercholesteremia    Lumbar herniated disc 2017   ? L 4 to L 5 no surgery done   PONV (postoperative nausea and vomiting)    nausea in car on way home dizzy and feels over medicated   Prostate cancer ([place]) 2022   Renal cyst    Social Anxiety    Tinnitus    not sure which ear   Wears glasses 03/11/2021    Surgical History: Past Surgical History:  Procedure Laterality Date   ANKLE ARTHRODESIS     age 62-bone frag,cyst-rt   cologard  2021   was negative   COLONOSCOPY  2011   CYSTOSCOPY N/A 03/15/2021   Procedure: CYSTOSCOPY;  Surgeon: Cleon Gustin, MD;  Location: [facility];  Service: Urology;  Laterality: N/A;   ORIF FINGER FRACTURE  06/15/2011   Procedure: OPEN REDUCTION INTERNAL FIXATION (ORIF) METACARPAL (FINGER) FRACTURE;  Surgeon: Wynonia Sours, MD;  Location: [place];  Service: Orthopedics;  Laterality: Right;  Proximal phalanx right little finger   RADIOACTIVE SEED IMPLANT N/A 03/15/2021   Procedure: RADIOACTIVE SEED IMPLANT/BRACHYTHERAPY IMPLANT;  Surgeon: Cleon Gustin, MD;  Location: [facility];  Service: Urology;  Laterality: N/A;   RETINAL DETACHMENT SURGERY Left 2016   laser in office   surgery for bladder cancer  2016    Home Medications:  Allergies as of 01/03/2022       Reactions   Nsaids  Anaphylaxis        Medication List        Accurate as of January 03, 2022  4:14 PM. If you have any questions, ask your nurse or doctor.          atorvastatin 40 MG tablet Commonly known as: LIPITOR Take 40 mg by mouth daily.   cyclobenzaprine 10 MG tablet Commonly known as: FLEXERIL Take 10 mg by mouth 3 (three) times daily as needed for muscle spasms.   mirabegron ER 25 MG Tb24 tablet Commonly known as: MYRBETRIQ Take 1 tablet (25 mg total) by mouth daily.   sertraline 100 MG tablet Commonly known as: ZOLOFT Take 100 mg by mouth daily.   silodosin 8 MG Caps capsule Commonly known as: RAPAFLO Take 1 capsule (8 mg total) by mouth daily with breakfast.   tamsulosin 0.4 MG Caps capsule Commonly known as: FLOMAX Take 1 capsule (0.4 mg total) by mouth in the morning and at bedtime.   traMADol 50 MG tablet Commonly known as: Ultram Take 1 tablet (50 mg total) by mouth every 6 (six) hours as needed.        Allergies:  Allergies  Allergen Reactions   Nsaids Anaphylaxis    Family History: Family History  Problem Relation Age of Onset   Heart disease Father    Lung cancer  Other     Social History:  reports that he quit smoking about 45 years ago. His smoking use included cigarettes. He has never used smokeless tobacco. He reports that he does not drink alcohol and does not use drugs.  ROS: All other review of systems were reviewed and are negative except what is noted above in HPI  Physical Exam: BP 137/87   Pulse 65   Constitutional:  Alert and oriented, No acute distress. HEENT: [place] AT, moist mucus membranes.  Trachea midline, no masses. Cardiovascular: No clubbing, cyanosis, or edema. Respiratory: Normal respiratory effort, no increased work of breathing. GI: Abdomen is soft, nontender, nondistended, no abdominal masses GU: No CVA tenderness.  Lymph: No cervical or inguinal lymphadenopathy. Skin: No rashes, bruises or suspicious  lesions. Neurologic: Grossly intact, no focal deficits, moving all 4 extremities. Psychiatric: Normal mood and affect.  Laboratory Data: Lab Results  Component Value Date   WBC 5.6 03/12/2021   HGB 15.1 03/12/2021   HCT 45.1 03/12/2021   MCV 85.7 03/12/2021   PLT 276 03/12/2021    Lab Results  Component Value Date   CREATININE 0.83 03/12/2021    Lab Results  Component Value Date   PSA 6.7 (H) 08/27/2019    No results found for: "TESTOSTERONE"  No results found for: "HGBA1C"  Urinalysis    Component Value Date/Time   APPEARANCEUR Clear 09/29/2021 1546   GLUCOSEU Negative 09/29/2021 1546   BILIRUBINUR Negative 09/29/2021 1546   PROTEINUR Negative 09/29/2021 1546   UROBILINOGEN negative (A) 08/27/2019 1102   NITRITE Negative 09/29/2021 1546   LEUKOCYTESUR Negative 09/29/2021 1546    Lab Results  Component Value Date   LABMICR Comment 09/29/2021    Pertinent Imaging:  No results found for this or any previous visit.  No results found for this or any previous visit.  No results found for this or any previous visit.  No results found for this or any previous visit.  No results found for this or any previous visit.  No results found for this or any previous visit.  No results found for this or any previous visit.  No results found for this or any previous visit.   Assessment & Plan:    1. Prostate cancer ([place]) -RTc 6 months with PSA - Urinalysis, Routine w reflex microscopic  2. Benign prostatic hyperplasia with urinary obstruction -continue flomax BID  3. Urinary urgency -Continue flomax 0.'4mg'$  BID   No follow-ups on file.  Nicolette Bang, MD  [facility] [place]

## 2022-01-03 NOTE — Patient Instructions (Signed)

## 2022-06-14 IMAGING — CT CT ABD-PEL WO/W CM
4 of 9 series · 12 of 46 positions shown, 17 images · IV contrast (omnipaque)
Comparison: Bone scan 12/17/2020. Report of an abdominopelvic CT
from [HOSPITAL] [facility] 05/27/2014.

CLINICAL DATA: New diagnosis of prostate cancer. History of bladder
cancer with surgery and chemotherapy in 0310.

EXAM:
CT ABDOMEN AND PELVIS WITHOUT AND WITH CONTRAST
TECHNIQUE: Multidetector CT imaging of the abdomen and pelvis was performed
following the standard protocol before and following the bolus
administration of intravenous contrast.
CONTRAST:  80mL OMNIPAQUE IOHEXOL 350 MG/ML SOLN

[Series 3: axial st · axial · 0.76mm/px · z∈[-260,-0]mm · 3 of 106 slices shown, 7 images (1 of 2)]
[im 27/106  soft-tissue]
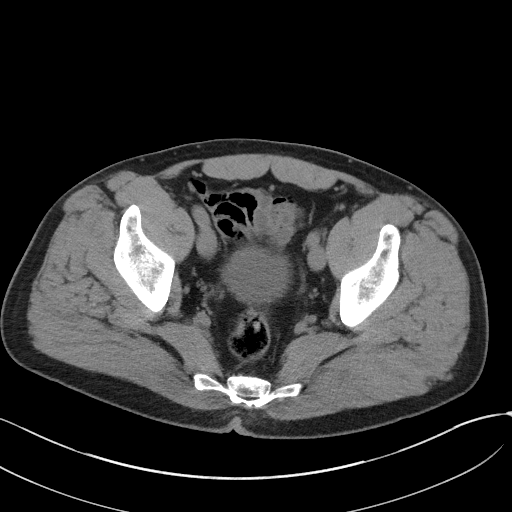
[im 27/106  lung]
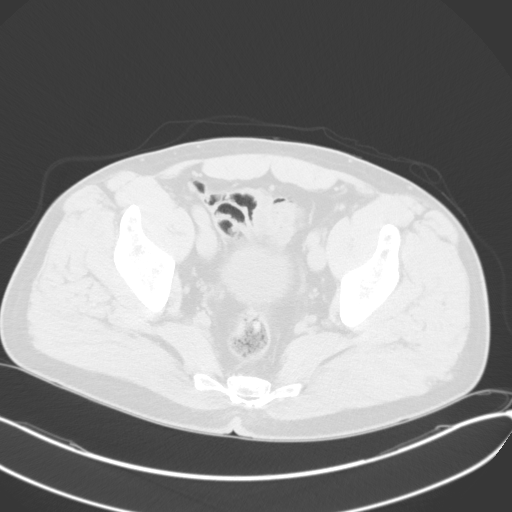
[im 27/106  bone]
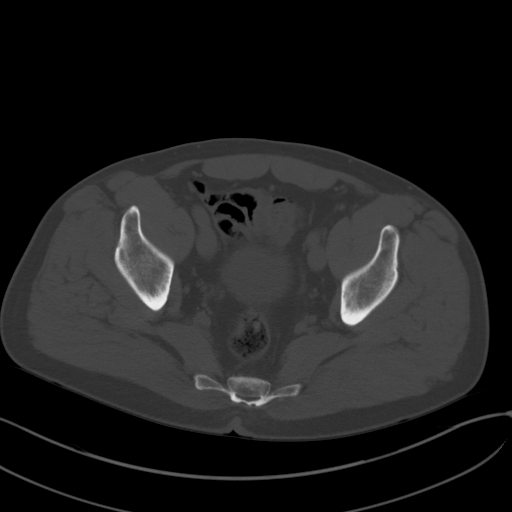
[im 53/106  soft-tissue]
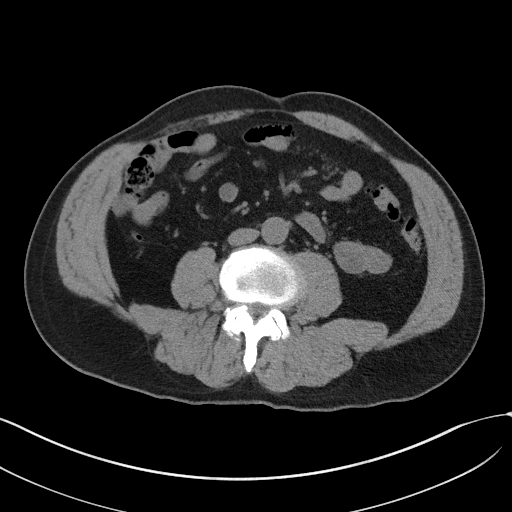
[im 53/106  lung]
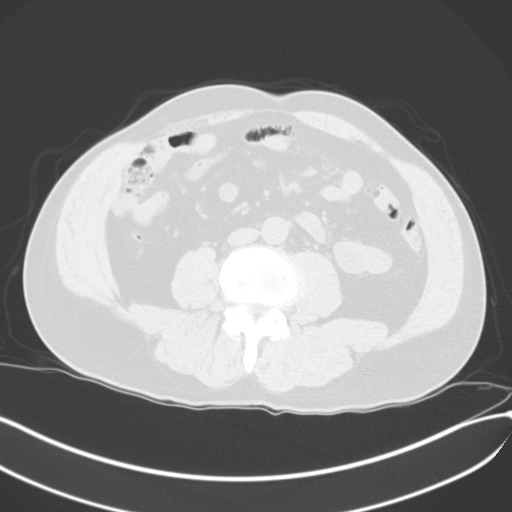
[im 79/106  soft-tissue]
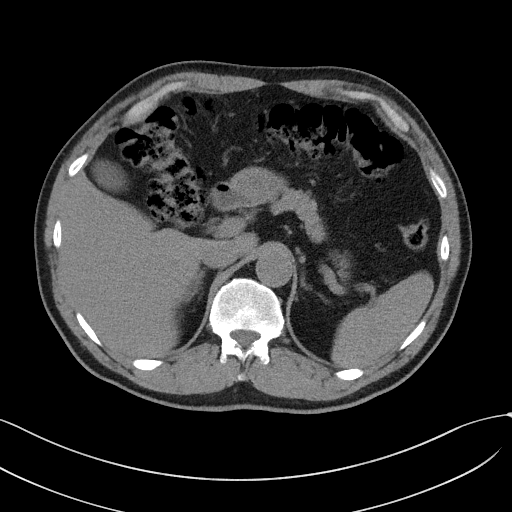
[im 79/106  lung]
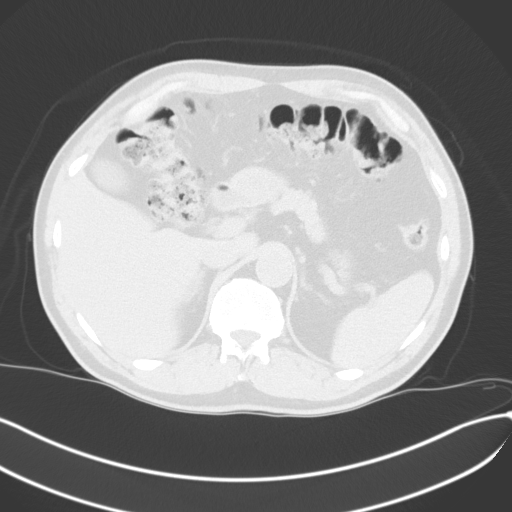

[Series 7: axial st · axial · 0.76mm/px · z∈[-270,-0]mm · 3 of 108 slices shown (2 of 2)]
[im 27/108  soft-tissue]
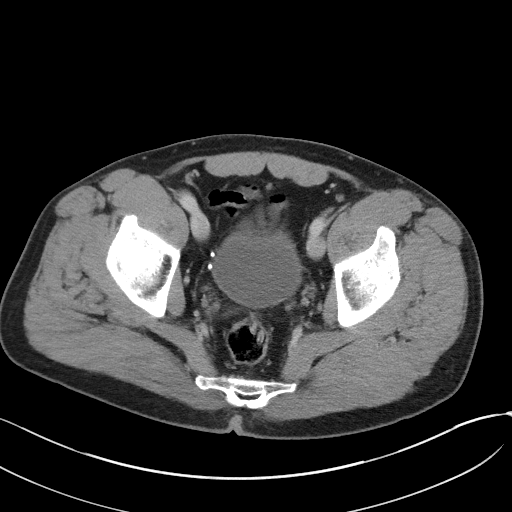
[im 54/108  soft-tissue]
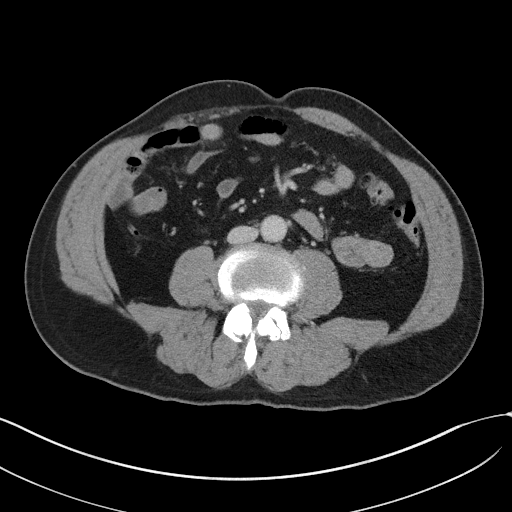
[im 81/108  soft-tissue]
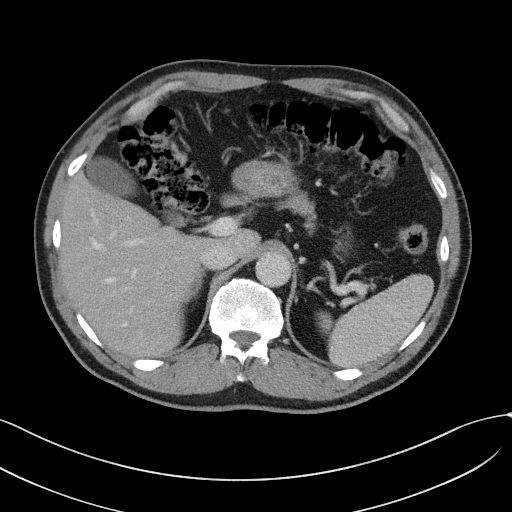

[Series 10: coronal st · coronal · 0.89mm/px · 3 of 142 slices shown, 4 images]
[im 29/142  soft-tissue]
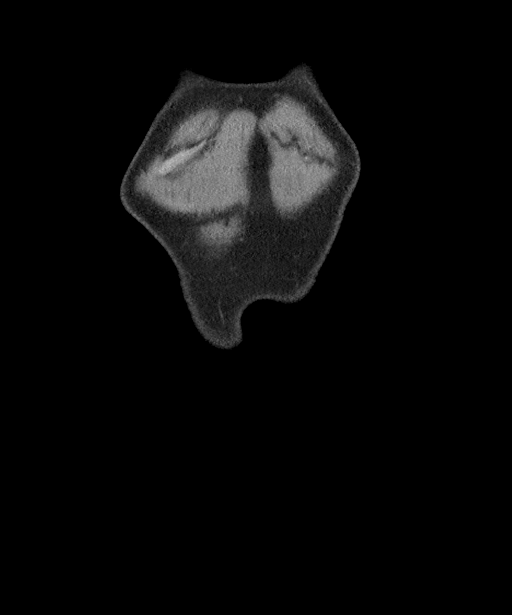
[im 57/142  soft-tissue]
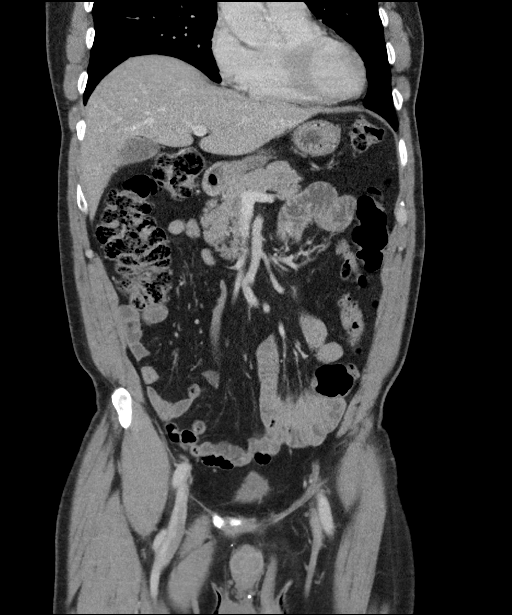
[im 57/142  bone]
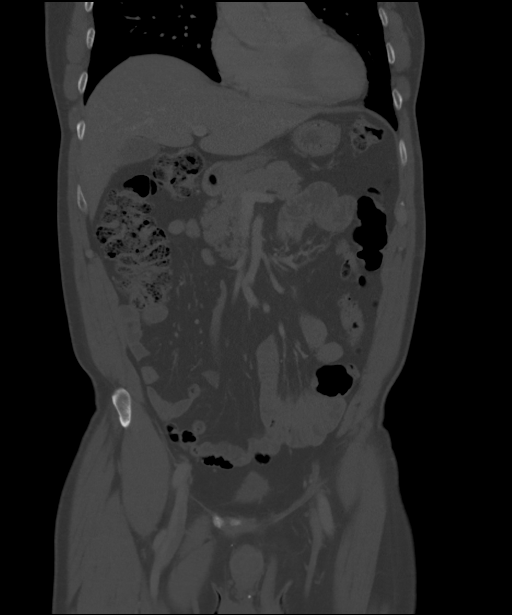
[im 85/142  soft-tissue]
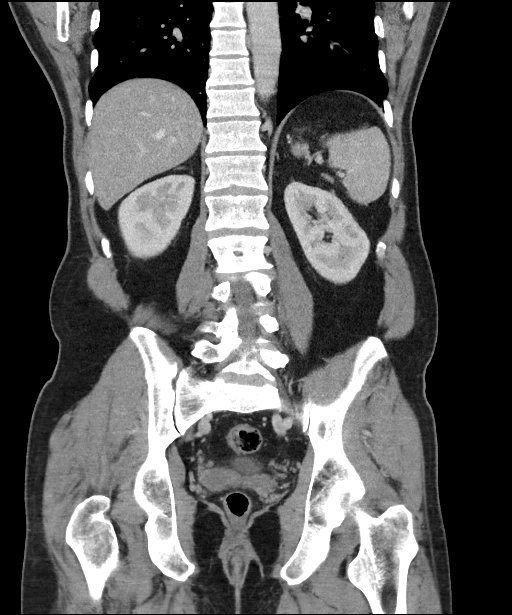

[Series 12: delays · axial · 0.72mm/px · z∈[-265,-0]mm · 3 of 107 slices shown]
[im 27/107  soft-tissue]
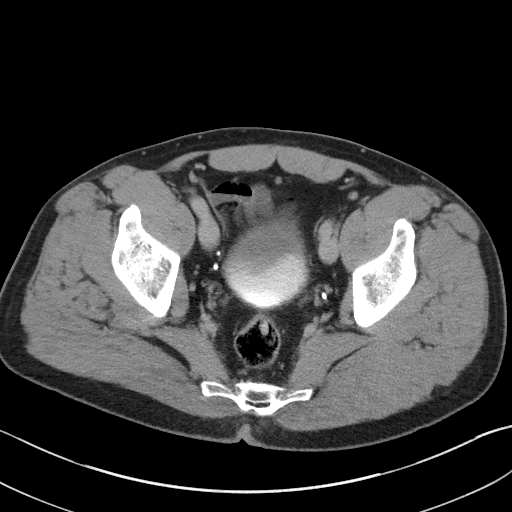
[im 54/107  soft-tissue]
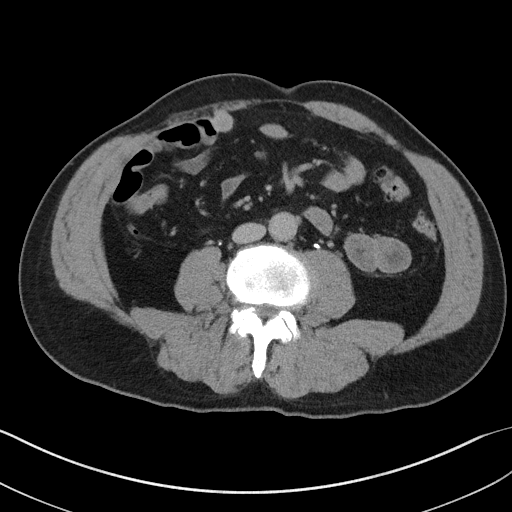
[im 80/107  soft-tissue]
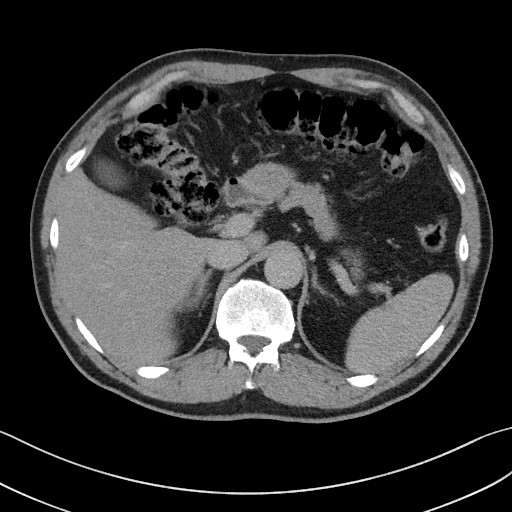

[12 of 46 positions shown; findings below may reference images not displayed]

FINDINGS: Lower chest: A 5 mm anterior right lung base nodule on [DATE] may
correspond to a 5 mm nodule described on the prior abdominal CT.

A subpleural right lower lobe 8 mm nodule on [DATE].

Calcified left lower lobe granuloma has been detailed previously.

3 mm left lower lobe pulmonary nodule on [DATE] was not described on
the prior. Normal heart size without pericardial or pleural
effusion.

Hepatobiliary: Mild hepatic steatosis. Tiny low-density liver
lesions are well-circumscribed and likely cysts. Similar lesions
were detailed on the prior report. Normal gallbladder, without
biliary ductal dilatation.

Pancreas: Normal, without mass or ductal dilatation.

Spleen: Old granulomatous disease in the spleen.

Adrenals/Urinary Tract: Normal adrenal glands. No renal calculi or
hydronephrosis. No hydroureter or ureteric calculi. No bladder
calculi.

Upper pole right renal 1.3 cm minimally complex cyst. Interpolar
left kidney too small to characterize lesion. No enhancing bladder
mass.

Stomach/Bowel: Normal stomach, without wall thickening. Descending
duodenal diverticulum. Otherwise normal small bowel. Scattered
colonic diverticula. Normal terminal ileum and appendix.

Vascular/Lymphatic: Aortic atherosclerosis. No abdominopelvic
adenopathy.

Reproductive: Symmetric seminal vesicles. Nonspecific right mid
gland peripheral area of hypoenhancement and hypoattenuation,
including at 2.2 cm on 90/12.

Other: No significant free fluid.

Musculoskeletal: Degenerative disc disease at the L4-5 level. No
worrisome focal osseous lesion.
IMPRESSION: 1. No acute process or evidence of metastatic disease in the abdomen
or pelvis. Nonspecific hypoattenuation and hypoenhancement within
the prostatic mid gland.
2. Bibasilar pulmonary nodules. Nonspecific, but unlikely to
represent metastatic disease. Consider dedicated chest CT to serve
as a baseline.
3.  Aortic Atherosclerosis ([id number]-AE0.0).

## 2022-06-28 ENCOUNTER — Other Ambulatory Visit: Payer: BC Managed Care – PPO

## 2022-06-29 LAB — PSA: Prostate Specific Ag, Serum: 3 ng/mL (ref 0.0–4.0)

## 2022-07-04 ENCOUNTER — Ambulatory Visit: Payer: BC Managed Care – PPO | Admitting: Urology

## 2022-07-04 VITALS — BP 149/90 | HR 70

## 2022-07-04 DIAGNOSIS — R3915 Urgency of urination: Secondary | ICD-10-CM

## 2022-07-04 DIAGNOSIS — C61 Malignant neoplasm of prostate: Secondary | ICD-10-CM

## 2022-07-04 DIAGNOSIS — R972 Elevated prostate specific antigen [PSA]: Secondary | ICD-10-CM

## 2022-07-04 DIAGNOSIS — N401 Enlarged prostate with lower urinary tract symptoms: Secondary | ICD-10-CM

## 2022-07-04 DIAGNOSIS — N138 Other obstructive and reflux uropathy: Secondary | ICD-10-CM

## 2022-07-04 LAB — BLADDER SCAN AMB NON-IMAGING: Scan Result: 27

## 2022-07-04 MED ORDER — SILODOSIN 8 MG PO CAPS: 8.0000 mg | ORAL_CAPSULE | Freq: Every day | ORAL | 11 refills | Status: DC

## 2022-07-04 NOTE — Patient Instructions (Signed)

## 2022-07-04 NOTE — Progress Notes (Unsigned)
07/04/2022 3:43 PM   Roger Vega 01/26/1956 [id number]  Referring provider: Curlene Labrum, MD [place],  [place] [address]  No chief complaint on file.   HPI: PSA increased to 3.0. IPSS 12 QOL 2 on flomax 0.'4mg'$  BID   PMH: Past Medical History:  Diagnosis Date   Arthritis    oa   Bladder cancer ([place]) 2016   History of COVID-19    8-10 months ago per wife on 03-11-2021   Hypercholesteremia    Lumbar herniated disc 2017   ? L 4 to L 5 no surgery done   PONV (postoperative nausea and vomiting)    nausea in car on way home dizzy and feels over medicated   Prostate cancer ([place]) 2022   Renal cyst    Social Anxiety    Tinnitus    not sure which ear   Wears glasses 03/11/2021    Surgical History: Past Surgical History:  Procedure Laterality Date   ANKLE ARTHRODESIS     age 44-bone frag,cyst-rt   cologard  2021   was negative   COLONOSCOPY  2011   CYSTOSCOPY N/A 03/15/2021   Procedure: CYSTOSCOPY;  Surgeon: Cleon Gustin, MD;  Location: [facility];  Service: Urology;  Laterality: N/A;   ORIF FINGER FRACTURE  06/15/2011   Procedure: OPEN REDUCTION INTERNAL FIXATION (ORIF) METACARPAL (FINGER) FRACTURE;  Surgeon: Wynonia Sours, MD;  Location: [place];  Service: Orthopedics;  Laterality: Right;  Proximal phalanx right little finger   RADIOACTIVE SEED IMPLANT N/A 03/15/2021   Procedure: RADIOACTIVE SEED IMPLANT/BRACHYTHERAPY IMPLANT;  Surgeon: Cleon Gustin, MD;  Location: [facility];  Service: Urology;  Laterality: N/A;   RETINAL DETACHMENT SURGERY Left 2016   laser in office   surgery for bladder cancer  2016    Home Medications:  Allergies as of 07/04/2022       Reactions   Nsaids Anaphylaxis        Medication List        Accurate as of July 04, 2022  3:43 PM. If you have any questions, ask your nurse or doctor.          atorvastatin 40 MG tablet Commonly known as:  LIPITOR Take 40 mg by mouth daily.   cyclobenzaprine 10 MG tablet Commonly known as: FLEXERIL Take 10 mg by mouth 3 (three) times daily as needed for muscle spasms.   sertraline 100 MG tablet Commonly known as: ZOLOFT Take 100 mg by mouth daily.   silodosin 8 MG Caps capsule Commonly known as: RAPAFLO Take 1 capsule (8 mg total) by mouth daily with breakfast.   tamsulosin 0.4 MG Caps capsule Commonly known as: FLOMAX Take 1 capsule (0.4 mg total) by mouth in the morning and at bedtime.        Allergies:  Allergies  Allergen Reactions   Nsaids Anaphylaxis    Family History: Family History  Problem Relation Age of Onset   Heart disease Father    Lung cancer Other     Social History:  reports that he quit smoking about 46 years ago. His smoking use included cigarettes. He has never used smokeless tobacco. He reports that he does not drink alcohol and does not use drugs.  ROS: All other review of systems were reviewed and are negative except what is noted above in HPI  Physical Exam: BP (!) 149/90   Pulse 70   Constitutional:  Alert and oriented, No acute  distress. HEENT: [place] AT, moist mucus membranes.  Trachea midline, no masses. Cardiovascular: No clubbing, cyanosis, or edema. Respiratory: Normal respiratory effort, no increased work of breathing. GI: Abdomen is soft, nontender, nondistended, no abdominal masses GU: No CVA tenderness.  Lymph: No cervical or inguinal lymphadenopathy. Skin: No rashes, bruises or suspicious lesions. Neurologic: Grossly intact, no focal deficits, moving all 4 extremities. Psychiatric: Normal mood and affect.  Laboratory Data: Lab Results  Component Value Date   WBC 5.6 03/12/2021   HGB 15.1 03/12/2021   HCT 45.1 03/12/2021   MCV 85.7 03/12/2021   PLT 276 03/12/2021    Lab Results  Component Value Date   CREATININE 0.83 03/12/2021    Lab Results  Component Value Date   PSA 6.7 (H) 08/27/2019    No results found for:  "TESTOSTERONE"  No results found for: "HGBA1C"  Urinalysis    Component Value Date/Time   APPEARANCEUR Clear 01/03/2022 1600   GLUCOSEU Negative 01/03/2022 1600   BILIRUBINUR Negative 01/03/2022 1600   PROTEINUR Negative 01/03/2022 1600   UROBILINOGEN negative (A) 08/27/2019 1102   NITRITE Negative 01/03/2022 1600   LEUKOCYTESUR Negative 01/03/2022 1600    Lab Results  Component Value Date   LABMICR Comment 01/03/2022    Pertinent Imaging: *** No results found for this or any previous visit.  No results found for this or any previous visit.  No results found for this or any previous visit.  No results found for this or any previous visit.  No results found for this or any previous visit.  No valid procedures specified. No results found for this or any previous visit.  No results found for this or any previous visit.   Assessment & Plan:    1. Prostate cancer ([place]) -followup 3 monhts - Urinalysis, Routine w reflex microscopic  2. . Urinary urgency -we will trial rapaflo '8mg'$  daily - BLADDER SCAN AMB NON-IMAGING  3. Benign prostatic hyperplasia with urinary obstruction ***   No follow-ups on file.  Nicolette Bang, MD  [facility] [place]

## 2022-07-04 NOTE — Progress Notes (Unsigned)
post void residual=27 

## 2022-07-05 ENCOUNTER — Encounter: Payer: Self-pay | Admitting: Urology

## 2022-07-05 LAB — URINALYSIS, ROUTINE W REFLEX MICROSCOPIC
Bilirubin, UA: NEGATIVE
Glucose, UA: NEGATIVE
Ketones, UA: NEGATIVE
Leukocytes,UA: NEGATIVE
Nitrite, UA: NEGATIVE
Protein,UA: NEGATIVE
RBC, UA: NEGATIVE
Specific Gravity, UA: 1.02 (ref 1.005–1.030)
Urobilinogen, Ur: 1 mg/dL (ref 0.2–1.0)
pH, UA: 6 (ref 5.0–7.5)

## 2022-07-29 IMAGING — DX DG CHEST 2V
2 series · 2 of 2 positions shown · non-contrast
Comparison: None.

CLINICAL DATA: Preop for prostate seed implant.

EXAM:
CHEST - 2 VIEW

[chest pa]
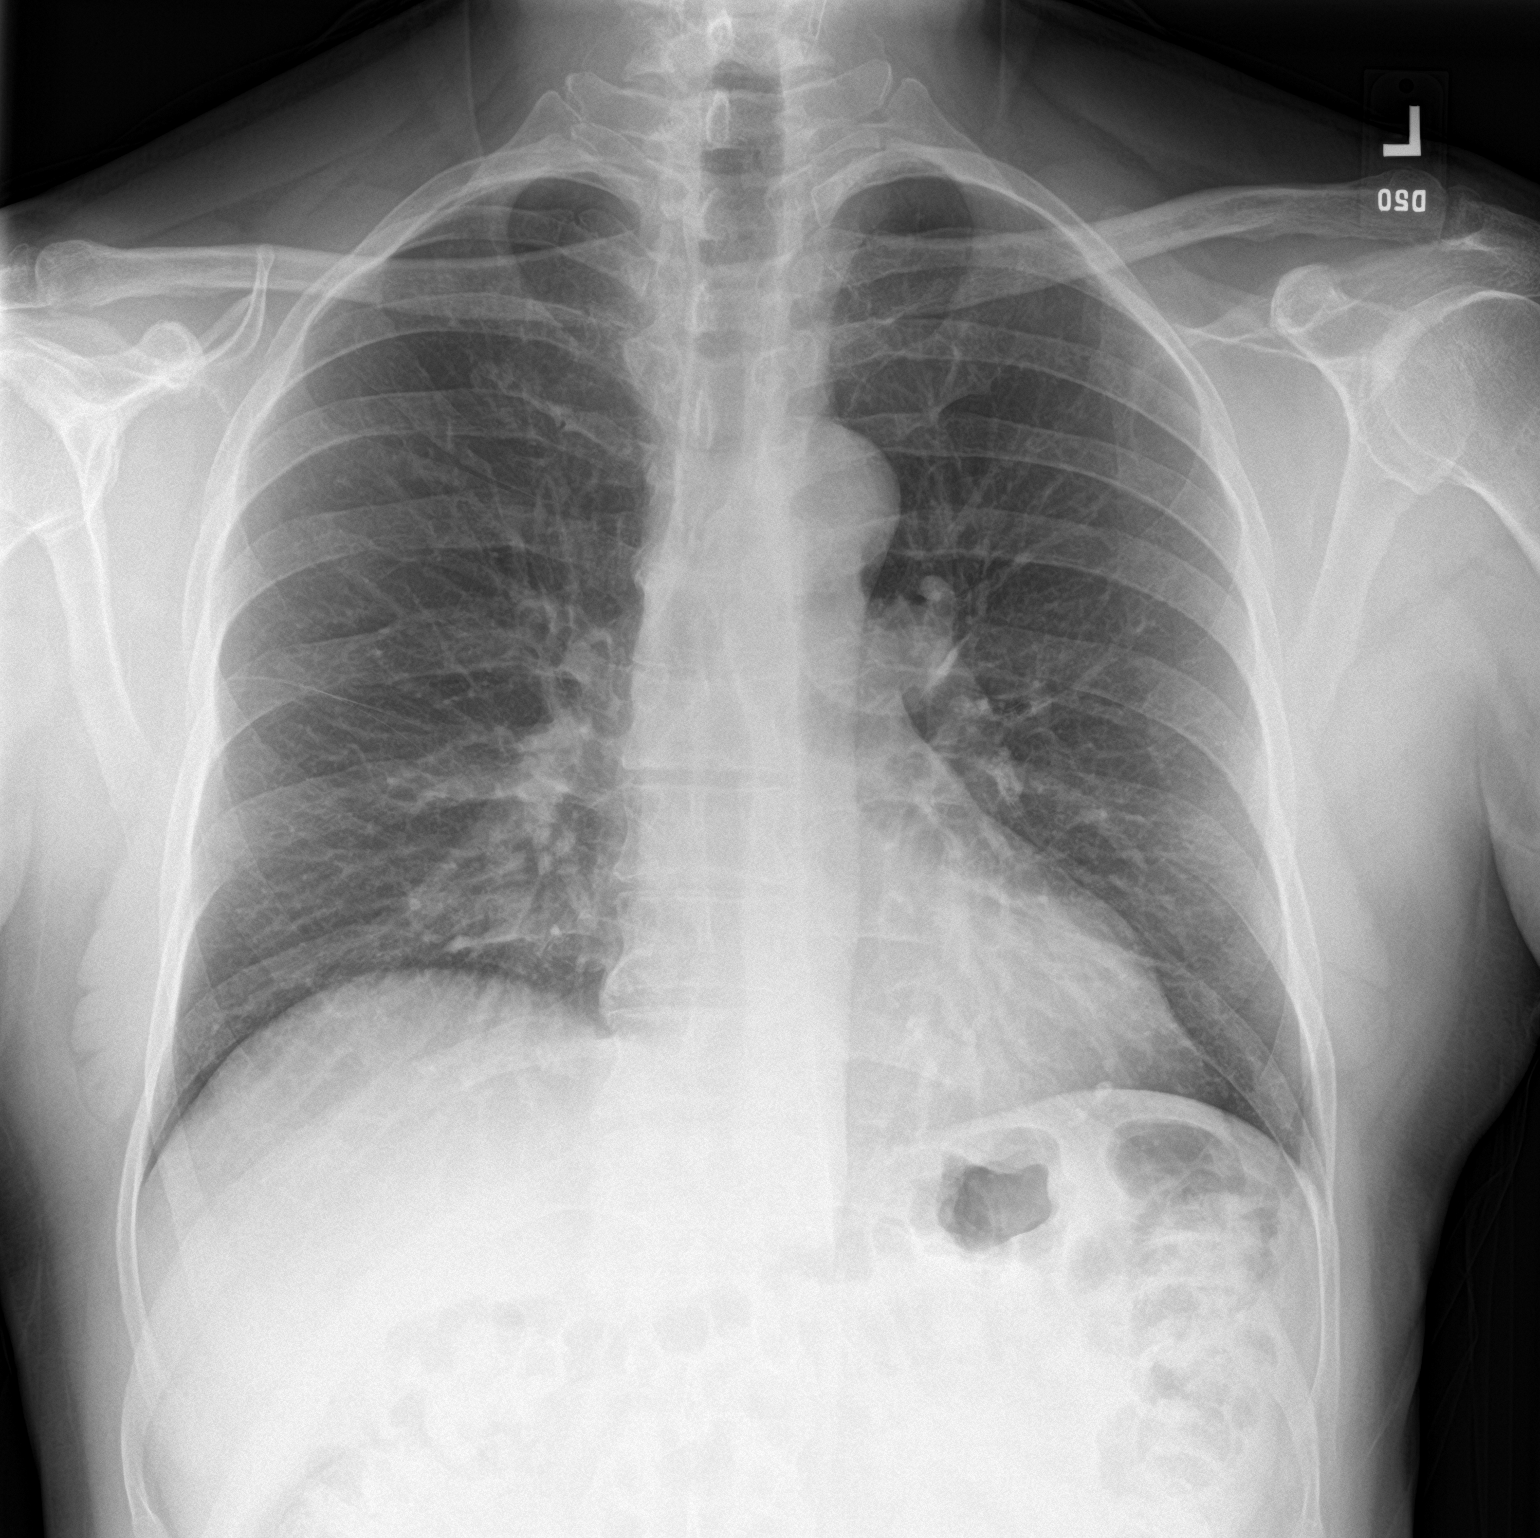

[chest lat]
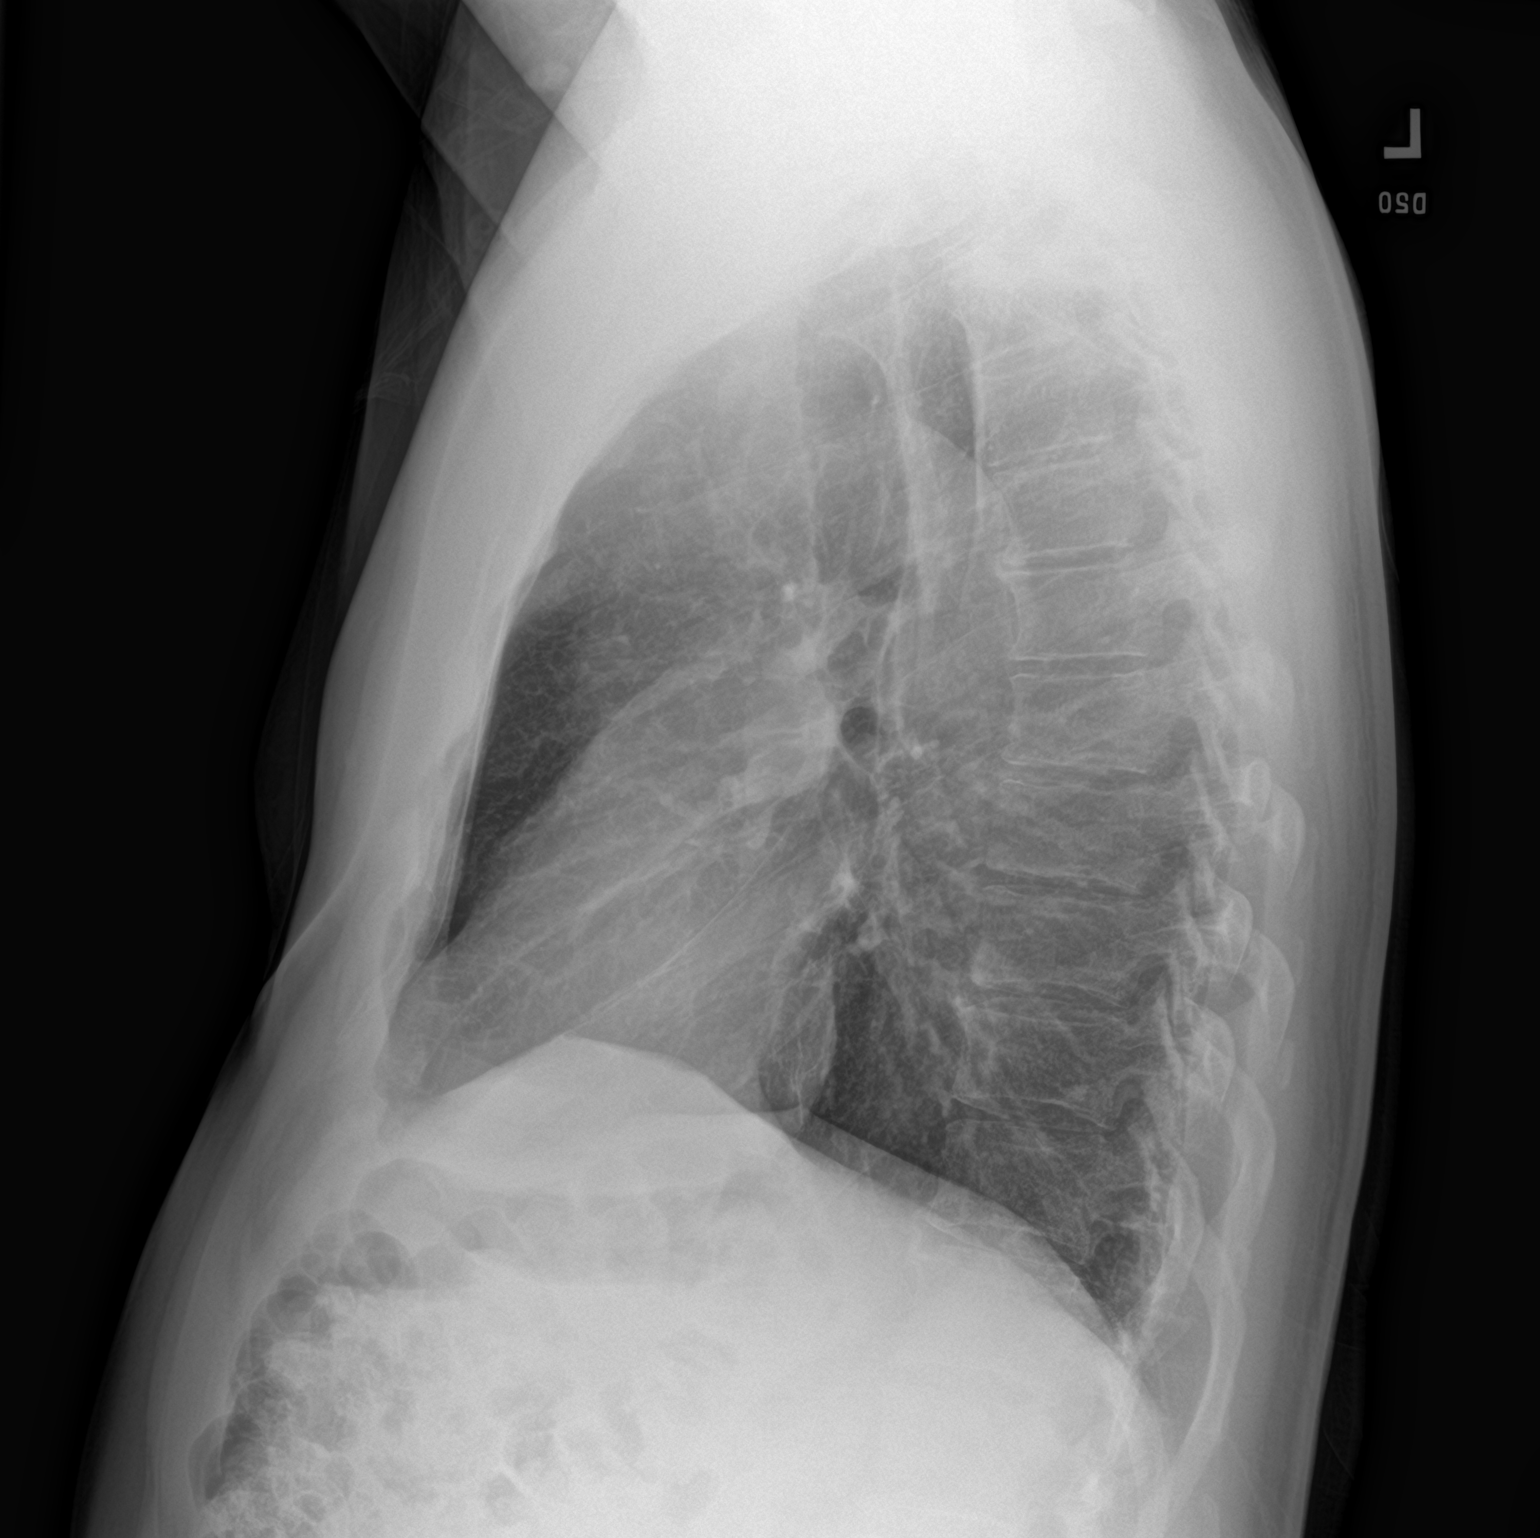

[2 of 2 positions shown; findings below may reference images not displayed]

FINDINGS: Midline trachea.  Normal heart size and mediastinal contours.

Sharp costophrenic angles.  No pneumothorax.  Clear lungs.

Mild right hemidiaphragm elevation.
IMPRESSION: No active cardiopulmonary disease.

## 2022-09-29 ENCOUNTER — Other Ambulatory Visit: Payer: BC Managed Care – PPO

## 2022-09-29 DIAGNOSIS — C61 Malignant neoplasm of prostate: Secondary | ICD-10-CM

## 2022-09-30 LAB — PSA: Prostate Specific Ag, Serum: 3.4 ng/mL (ref 0.0–4.0)

## 2022-10-04 ENCOUNTER — Other Ambulatory Visit: Payer: BC Managed Care – PPO

## 2022-10-06 ENCOUNTER — Other Ambulatory Visit: Payer: BC Managed Care – PPO

## 2022-10-11 ENCOUNTER — Ambulatory Visit: Payer: BC Managed Care – PPO | Admitting: Urology

## 2022-10-11 VITALS — BP 159/87 | HR 72

## 2022-10-11 DIAGNOSIS — N401 Enlarged prostate with lower urinary tract symptoms: Secondary | ICD-10-CM

## 2022-10-11 DIAGNOSIS — N138 Other obstructive and reflux uropathy: Secondary | ICD-10-CM | POA: Diagnosis not present

## 2022-10-11 DIAGNOSIS — R3915 Urgency of urination: Secondary | ICD-10-CM

## 2022-10-11 DIAGNOSIS — C61 Malignant neoplasm of prostate: Secondary | ICD-10-CM

## 2022-10-11 MED ORDER — SILODOSIN 8 MG PO CAPS: 8.0000 mg | ORAL_CAPSULE | Freq: Every day | ORAL | 11 refills | Status: DC

## 2022-10-11 NOTE — Progress Notes (Signed)
10/11/2022 4:11 PM   Roger Vega [birth year] [id number]  Referring provider: Juliette Alcide, MD [address] [place],  [place] [address]  Followuop prostate cancer and BPH   HPI: Roger Vega is a 66yo here for followup for prostate cancer and BPH. IPSS 7 QOL 2 on rapaflo 8mg . Uirne stream strong. No straining to urinate. Nocturia 1-2x depending on fluid consumption. PSA increased ot 3.4 from 3.0. He had brachytherapy in 02/2021.    PMH: Past Medical History:  Diagnosis Date   Arthritis    oa   Bladder cancer (HCC) 2016   History of COVID-19    8-10 months ago per wife on 03-11-2021   Hypercholesteremia    Lumbar herniated disc 2017   ? L 4 to L 5 no surgery done   PONV (postoperative nausea and vomiting)    nausea in car on way home dizzy and feels over medicated   Prostate cancer (HCC) 2022   Renal cyst    Social Anxiety    Tinnitus    not sure which ear   Wears glasses 03/11/2021    Surgical History: Past Surgical History:  Procedure Laterality Date   ANKLE ARTHRODESIS     age 53-bone frag,cyst-rt   cologard  2021   was negative   COLONOSCOPY  2011   CYSTOSCOPY N/A 03/15/2021   Procedure: CYSTOSCOPY;  Surgeon: Malen Gauze, MD;  Location: [facility];  Service: Urology;  Laterality: N/A;   ORIF FINGER FRACTURE  06/15/2011   Procedure: OPEN REDUCTION INTERNAL FIXATION (ORIF) METACARPAL (FINGER) FRACTURE;  Surgeon: Nicki Reaper, MD;  Location: [facility];  Service: Orthopedics;  Laterality: Right;  Proximal phalanx right little finger   RADIOACTIVE SEED IMPLANT N/A 03/15/2021   Procedure: RADIOACTIVE SEED IMPLANT/BRACHYTHERAPY IMPLANT;  Surgeon: Malen Gauze, MD;  Location: [facility];  Service: Urology;  Laterality: N/A;   RETINAL DETACHMENT SURGERY Left 2016   laser in office   surgery for bladder cancer  2016    Home Medications:  Allergies as of 10/11/2022       Reactions   Nsaids Anaphylaxis         Medication List        Accurate as of October 11, 2022  4:11 PM. If you have any questions, ask your nurse or doctor.          atorvastatin 40 MG tablet Commonly known as: LIPITOR Take 40 mg by mouth daily.   cyclobenzaprine 10 MG tablet Commonly known as: FLEXERIL Take 10 mg by mouth 3 (three) times daily as needed for muscle spasms.   sertraline 100 MG tablet Commonly known as: ZOLOFT Take 100 mg by mouth daily.   silodosin 8 MG Caps capsule Commonly known as: RAPAFLO Take 1 capsule (8 mg total) by mouth daily with breakfast.   tamsulosin 0.4 MG Caps capsule Commonly known as: FLOMAX Take 1 capsule (0.4 mg total) by mouth in the morning and at bedtime.        Allergies:  Allergies  Allergen Reactions   Nsaids Anaphylaxis    Family History: Family History  Problem Relation Age of Onset   Heart disease Father    Lung cancer Other     Social History:  reports that he quit smoking about 46 years ago. His smoking use included cigarettes. He has never used smokeless tobacco. He reports that he does not drink alcohol and does not use drugs.  ROS: All other review  of systems were reviewed and are negative except what is noted above in HPI  Physical Exam: BP (!) 159/87   Pulse 72   Constitutional:  Alert and oriented, No acute distress. HEENT: [place] AT, moist mucus membranes.  Trachea midline, no masses. Cardiovascular: No clubbing, cyanosis, or edema. Respiratory: Normal respiratory effort, no increased work of breathing. GI: Abdomen is soft, nontender, nondistended, no abdominal masses GU: No CVA tenderness.  Lymph: No cervical or inguinal lymphadenopathy. Skin: No rashes, bruises or suspicious lesions. Neurologic: Grossly intact, no focal deficits, moving all 4 extremities. Psychiatric: Normal mood and affect.  Laboratory Data: Lab Results  Component Value Date   WBC 5.6 03/12/2021   HGB 15.1 03/12/2021   HCT 45.1 03/12/2021   MCV 85.7  03/12/2021   PLT 276 03/12/2021    Lab Results  Component Value Date   CREATININE 0.83 03/12/2021    Lab Results  Component Value Date   PSA 6.7 (H) 08/27/2019    No results found for: "TESTOSTERONE"  No results found for: "HGBA1C"  Urinalysis    Component Value Date/Time   APPEARANCEUR Clear 07/04/2022 1544   GLUCOSEU Negative 07/04/2022 1544   BILIRUBINUR Negative 07/04/2022 1544   PROTEINUR Negative 07/04/2022 1544   UROBILINOGEN negative (A) 08/27/2019 1102   NITRITE Negative 07/04/2022 1544   LEUKOCYTESUR Negative 07/04/2022 1544    Lab Results  Component Value Date   LABMICR Comment 07/04/2022    Pertinent Imaging:  No results found for this or any previous visit.  No results found for this or any previous visit.  No results found for this or any previous visit.  No results found for this or any previous visit.  No results found for this or any previous visit.  No valid procedures specified. No results found for this or any previous visit.  No results found for this or any previous visit.   Assessment & Plan:    1. Prostate cancer (HCC) -followup 3 months with psa - Urinalysis, Routine w reflex microscopic  2. Benign prostatic hyperplasia with urinary obstruction Continue rapalfo 8mg  daily  3. Urinary urgency Continue rapaflo 8mg  daily   No follow-ups on file.  Wilkie Aye, MD  [facility] [place]

## 2022-10-12 LAB — URINALYSIS, ROUTINE W REFLEX MICROSCOPIC
Bilirubin, UA: NEGATIVE
Glucose, UA: NEGATIVE
Ketones, UA: NEGATIVE
Leukocytes,UA: NEGATIVE
Nitrite, UA: NEGATIVE
Protein,UA: NEGATIVE
RBC, UA: NEGATIVE
Specific Gravity, UA: 1.02 (ref 1.005–1.030)
Urobilinogen, Ur: 2 mg/dL — ABNORMAL HIGH (ref 0.2–1.0)
pH, UA: 6.5 (ref 5.0–7.5)

## 2022-10-18 ENCOUNTER — Encounter: Payer: Self-pay | Admitting: Urology

## 2022-10-18 ENCOUNTER — Telehealth: Payer: Self-pay

## 2022-10-18 NOTE — Patient Instructions (Signed)

## 2022-10-18 NOTE — Telephone Encounter (Signed)
PA STARTED FOR SILODOSIN ((Key: [id number])

## 2022-12-30 ENCOUNTER — Other Ambulatory Visit: Payer: BC Managed Care – PPO

## 2022-12-30 DIAGNOSIS — C61 Malignant neoplasm of prostate: Secondary | ICD-10-CM

## 2022-12-31 LAB — PSA: Prostate Specific Ag, Serum: 4.5 ng/mL — ABNORMAL HIGH (ref 0.0–4.0)

## 2023-01-02 ENCOUNTER — Other Ambulatory Visit: Payer: BC Managed Care – PPO

## 2023-01-05 ENCOUNTER — Encounter: Payer: Self-pay | Admitting: Urology

## 2023-01-05 DIAGNOSIS — C61 Malignant neoplasm of prostate: Secondary | ICD-10-CM

## 2023-01-09 MED ORDER — LEVOFLOXACIN 750 MG PO TABS: 750.0000 mg | ORAL_TABLET | Freq: Once | ORAL | 0 refills | Status: AC

## 2023-02-06 ENCOUNTER — Other Ambulatory Visit: Payer: Self-pay | Admitting: Urology

## 2023-02-06 ENCOUNTER — Ambulatory Visit: Payer: BC Managed Care – PPO | Admitting: Urology

## 2023-02-06 ENCOUNTER — Encounter (HOSPITAL_COMMUNITY): Payer: Self-pay

## 2023-02-06 ENCOUNTER — Ambulatory Visit (HOSPITAL_COMMUNITY)
Admission: RE | Admit: 2023-02-06 | Discharge: 2023-02-06 | Disposition: A | Discharge status: Home / Self Care | Payer: BC Managed Care – PPO | Source: Ambulatory Visit | Attending: Urology | Admitting: Urology

## 2023-02-06 VITALS — BP 148/82 | HR 72 | Temp 98.7°F | Resp 18

## 2023-02-06 DIAGNOSIS — C61 Malignant neoplasm of prostate: Secondary | ICD-10-CM | POA: Insufficient documentation

## 2023-02-06 DIAGNOSIS — R9721 Rising PSA following treatment for malignant neoplasm of prostate: Secondary | ICD-10-CM

## 2023-02-06 DIAGNOSIS — Z8546 Personal history of malignant neoplasm of prostate: Secondary | ICD-10-CM

## 2023-02-06 DIAGNOSIS — N401 Enlarged prostate with lower urinary tract symptoms: Secondary | ICD-10-CM

## 2023-02-06 MED ORDER — LIDOCAINE HCL (PF) 2 % IJ SOLN
10.0000 mL | Freq: Once | INTRAMUSCULAR | Status: AC
  Administered 2023-02-06: 10 mL

## 2023-02-06 MED ORDER — LIDOCAINE HCL (PF) 2 % IJ SOLN
INTRAMUSCULAR | Status: AC
  Filled 2023-02-06: qty 10

## 2023-02-06 MED ORDER — GENTAMICIN SULFATE 40 MG/ML IJ SOLN
INTRAMUSCULAR | Status: AC
  Filled 2023-02-06: qty 2

## 2023-02-06 MED ORDER — GENTAMICIN SULFATE 40 MG/ML IJ SOLN
80.0000 mg | Freq: Once | INTRAMUSCULAR | Status: AC
  Administered 2023-02-06: 80 mg via INTRAMUSCULAR

## 2023-02-06 NOTE — Patient Instructions (Signed)
Transrectal Ultrasound-Guided Prostate Biopsy, Care After What can I expect after the procedure? After the procedure, it is common to have: Pain and discomfort near your butt (rectum), especially while sitting. Pink-colored pee (urine). This is due to small amounts of blood in your pee. A burning feeling while peeing. Blood in your poop (stool). Bleeding from your butt. Blood in your semen. Follow these instructions at home: Medicines Take over-the-counter and prescription medicines only as told by your doctor. If you were given a sedative during your procedure, do not drive or use machines until your doctor says that it is safe. A sedative is a medicine that helps you relax. If you were prescribed an antibiotic medicine, take it as told by your doctor. Do not stop taking it even if you start to feel better. Activity  Return to your normal activities when your doctor says that it is safe. Ask your doctor when it is okay for you to have sex. You may have to avoid lifting. Ask your doctor how much you can safely lift. General instructions  Drink enough water to keep your pee pale yellow. Watch your pee, poop, and semen for new bleeding or bleeding that gets worse. Keep all follow-up visits. Contact a doctor if: You have any of these: Blood clots in your pee or poop. Blood in your pee more than 2 weeks after the procedure. Blood in your semen more than 2 months after the procedure. New or worse bleeding in your pee, poop, or semen. Very bad belly pain. Your pee smells bad or unusual. You have trouble peeing. Your lower belly feels firm. You have problems getting an erection. You feel like you may vomit (are nauseous), or you vomit. Get help right away if: You have a fever or chills. You have bright red pee. You have very bad pain that does not get better with medicine. You cannot pee. Summary After this procedure, it is common to have pain and discomfort near your butt,  especially while sitting. You may have blood in your pee and poop. It is common to have blood in your semen. Get help right away if you have a fever or chills. This information is not intended to replace advice given to you by your health care provider. Make sure you discuss any questions you have with your health care provider. Document Revised: 10/05/2020 Document Reviewed: 10/05/2020 Elsevier Patient Education  2024 [employer].

## 2023-02-06 NOTE — Progress Notes (Signed)
PT tolerated prostate biopsy procedure and antibiotic injection well today. Labs obtained and sent for pathology by Lexi. PT ambulatory at discharge with no acute distress noted and verbalized understanding of discharge instructions. PT to follow up with urologist as scheduled on 02/22/23 @ 3:50pm.

## 2023-02-06 NOTE — Progress Notes (Signed)
Prostate Biopsy Procedure   Informed consent was obtained after discussing risks/benefits of the procedure.  A time out was performed to ensure correct patient identity.  Pre-Procedure: - Last PSA Level:  Lab Results  Component Value Date   PSA 6.7 (H) 08/27/2019   - Gentamicin given prophylactically - Levaquin 500 mg administered PO -Transrectal Ultrasound performed revealing a 28.4 gm prostate -No significant hypoechoic or median lobe noted  Procedure: - Prostate block performed using 10 cc 1% lidocaine and biopsies taken from sextant areas, a total of 12 under ultrasound guidance.  Post-Procedure: - Patient tolerated the procedure well - He was counseled to seek immediate medical attention if experiences any severe pain, significant bleeding, or fevers - Return in one week to discuss biopsy results

## 2023-02-07 LAB — SURGICAL PATHOLOGY

## 2023-02-21 ENCOUNTER — Telehealth: Payer: Self-pay | Admitting: Urology

## 2023-02-21 NOTE — Telephone Encounter (Signed)
Patient wants to know if its ok for him to wait until Jan 24 to have the Biopsy Talk?

## 2023-02-21 NOTE — Telephone Encounter (Signed)
Patient called and made aware of negative prostate biopsy. Appointment rescheduled for 6 month ov with psa prior.

## 2023-02-22 ENCOUNTER — Ambulatory Visit: Payer: BC Managed Care – PPO | Admitting: Urology

## 2023-02-22 NOTE — Telephone Encounter (Signed)
Patient's wife called back to discuss having PSA checked before 6 month follow up.  She is concerned with the elevated trend.  Please call wife.  Call [place] at [phone]

## 2023-03-21 ENCOUNTER — Other Ambulatory Visit: Payer: BC Managed Care – PPO

## 2023-03-21 ENCOUNTER — Telehealth: Payer: Self-pay

## 2023-03-21 NOTE — Telephone Encounter (Signed)
PT was called to verify if he wanted Rx sent to center instead of [facility]. Waiting on a response.

## 2023-03-22 NOTE — Telephone Encounter (Signed)
Pt wants rx sent to [facility]

## 2023-03-29 ENCOUNTER — Ambulatory Visit: Payer: BC Managed Care – PPO | Admitting: Urology

## 2023-04-06 DIAGNOSIS — J019 Acute sinusitis, unspecified: Secondary | ICD-10-CM | POA: Diagnosis not present

## 2023-04-06 DIAGNOSIS — B9789 Other viral agents as the cause of diseases classified elsewhere: Secondary | ICD-10-CM | POA: Diagnosis not present

## 2023-04-10 ENCOUNTER — Other Ambulatory Visit: Payer: Self-pay

## 2023-04-10 MED ORDER — SILODOSIN 8 MG PO CAPS: 8.0000 mg | ORAL_CAPSULE | Freq: Every day | ORAL | 5 refills | Status: DC

## 2023-04-10 NOTE — Progress Notes (Signed)
Patient requested rx be sent to [facility].

## 2023-05-19 ENCOUNTER — Ambulatory Visit: Payer: BC Managed Care – PPO | Admitting: Urology

## 2023-05-25 ENCOUNTER — Other Ambulatory Visit: Payer: Medicare HMO

## 2023-05-25 DIAGNOSIS — C61 Malignant neoplasm of prostate: Secondary | ICD-10-CM

## 2023-05-26 LAB — PSA: Prostate Specific Ag, Serum: 5.3 ng/mL — ABNORMAL HIGH (ref 0.0–4.0)

## 2023-05-31 ENCOUNTER — Ambulatory Visit (INDEPENDENT_AMBULATORY_CARE_PROVIDER_SITE_OTHER): Payer: Medicare HMO | Admitting: Urology

## 2023-05-31 VITALS — BP 157/76 | HR 76

## 2023-05-31 DIAGNOSIS — C679 Malignant neoplasm of bladder, unspecified: Secondary | ICD-10-CM

## 2023-05-31 DIAGNOSIS — Z8546 Personal history of malignant neoplasm of prostate: Secondary | ICD-10-CM

## 2023-05-31 DIAGNOSIS — Z08 Encounter for follow-up examination after completed treatment for malignant neoplasm: Secondary | ICD-10-CM

## 2023-05-31 DIAGNOSIS — C61 Malignant neoplasm of prostate: Secondary | ICD-10-CM

## 2023-05-31 DIAGNOSIS — N400 Enlarged prostate without lower urinary tract symptoms: Secondary | ICD-10-CM | POA: Diagnosis not present

## 2023-05-31 DIAGNOSIS — Z8551 Personal history of malignant neoplasm of bladder: Secondary | ICD-10-CM

## 2023-05-31 LAB — URINALYSIS, ROUTINE W REFLEX MICROSCOPIC
Bilirubin, UA: NEGATIVE
Glucose, UA: NEGATIVE
Ketones, UA: NEGATIVE
Leukocytes,UA: NEGATIVE
Nitrite, UA: NEGATIVE
Protein,UA: NEGATIVE
RBC, UA: NEGATIVE
Specific Gravity, UA: <1.005 — ABNORMAL LOW (ref 1.005–1.030)
Urobilinogen, Ur: 0.2 mg/dL (ref 0.2–1.0)
pH, UA: 7 (ref 5.0–7.5)

## 2023-05-31 MED ORDER — CIPROFLOXACIN HCL 500 MG PO TABS
500.0000 mg | ORAL_TABLET | Freq: Once | ORAL | Status: AC
  Administered 2023-05-31: 500 mg via ORAL

## 2023-05-31 NOTE — Progress Notes (Signed)
 05/31/2023 9:33 AM   Roger Vega 1955/06/26 [id number]  Referring provider: Lari Elspeth BRAVO, MD [address] [place],  [place] [address]  Followup prostate cancer   HPI: Roger Vega is a 67yo here for followup for prostate cancer and bladder cancer. PSA increased to 5.3 from 4.5. NO new bone pain. Last prostate biopsy 3 months ago was negative for malignancy. He denies any hematuria or dysuria. No worsening LUTS. No other complaints today   PMH: Past Medical History:  Diagnosis Date   Arthritis    oa   Bladder cancer (HCC) 2016   History of COVID-19    8-10 months ago per wife on 03-11-2021   Hypercholesteremia    Lumbar herniated disc 2017   ? L 4 to L 5 no surgery done   PONV (postoperative nausea and vomiting)    nausea in car on way home dizzy and feels over medicated   Prostate cancer (HCC) 2022   Renal cyst    Social Anxiety    Tinnitus    not sure which ear   Wears glasses 03/11/2021    Surgical History: Past Surgical History:  Procedure Laterality Date   ANKLE ARTHRODESIS     age 46-bone frag,cyst-rt   cologard  2021   was negative   COLONOSCOPY  2011   CYSTOSCOPY N/A 03/15/2021   Procedure: CYSTOSCOPY;  Surgeon: Sherrilee Belvie CROME, MD;  Location: [facility];  Service: Urology;  Laterality: N/A;   ORIF FINGER FRACTURE  06/15/2011   Procedure: OPEN REDUCTION INTERNAL FIXATION (ORIF) METACARPAL (FINGER) FRACTURE;  Surgeon: Arley JONELLE Curia, MD;  Location: [facility];  Service: Orthopedics;  Laterality: Right;  Proximal phalanx right little finger   RADIOACTIVE SEED IMPLANT N/A 03/15/2021   Procedure: RADIOACTIVE SEED IMPLANT/BRACHYTHERAPY IMPLANT;  Surgeon: Sherrilee Belvie CROME, MD;  Location: [facility];  Service: Urology;  Laterality: N/A;   RETINAL DETACHMENT SURGERY Left 2016   laser in office   surgery for bladder cancer  2016    Home Medications:  Allergies as of 05/31/2023       Reactions   Nsaids  Anaphylaxis        Medication List        Accurate as of May 31, 2023  9:33 AM. If you have any questions, ask your nurse or doctor.          atorvastatin 40 MG tablet Commonly known as: LIPITOR Take 40 mg by mouth daily.   cyclobenzaprine 10 MG tablet Commonly known as: FLEXERIL Take 10 mg by mouth 3 (three) times daily as needed for muscle spasms.   sertraline 100 MG tablet Commonly known as: ZOLOFT Take 100 mg by mouth daily.   silodosin  8 MG Caps capsule Commonly known as: RAPAFLO  Take 1 capsule (8 mg total) by mouth daily with breakfast.   tamsulosin  0.4 MG Caps capsule Commonly known as: FLOMAX  Take 1 capsule (0.4 mg total) by mouth in the morning and at bedtime.        Allergies:  Allergies  Allergen Reactions   Nsaids Anaphylaxis    Family History: Family History  Problem Relation Age of Onset   Heart disease Father    Lung cancer Other     Social History:  reports that he quit smoking about 46 years ago. His smoking use included cigarettes. He has never used smokeless tobacco. He reports that he does not drink alcohol and does not use drugs.  ROS: All other review  of systems were reviewed and are negative except what is noted above in HPI  Physical Exam: BP (!) 157/76   Pulse 76   Constitutional:  Alert and oriented, No acute distress. HEENT: [place] AT, moist mucus membranes.  Trachea midline, no masses. Cardiovascular: No clubbing, cyanosis, or edema. Respiratory: Normal respiratory effort, no increased work of breathing. GI: Abdomen is soft, nontender, nondistended, no abdominal masses GU: No CVA tenderness.  Lymph: No cervical or inguinal lymphadenopathy. Skin: No rashes, bruises or suspicious lesions. Neurologic: Grossly intact, no focal deficits, moving all 4 extremities. Psychiatric: Normal mood and affect.  Laboratory Data: Lab Results  Component Value Date   WBC 5.6 03/12/2021   HGB 15.1 03/12/2021   HCT 45.1 03/12/2021    MCV 85.7 03/12/2021   PLT 276 03/12/2021    Lab Results  Component Value Date   CREATININE 0.83 03/12/2021    Lab Results  Component Value Date   PSA 6.7 (H) 08/27/2019    No results found for: TESTOSTERONE   No results found for: HGBA1C  Urinalysis    Component Value Date/Time   APPEARANCEUR Clear 10/11/2022 1559   GLUCOSEU Negative 10/11/2022 1559   BILIRUBINUR Negative 10/11/2022 1559   PROTEINUR Negative 10/11/2022 1559   UROBILINOGEN negative (A) 08/27/2019 1102   NITRITE Negative 10/11/2022 1559   LEUKOCYTESUR Negative 10/11/2022 1559    Lab Results  Component Value Date   LABMICR Comment 10/11/2022    Pertinent Imaging:  No results found for this or any previous visit.  No results found for this or any previous visit.  No results found for this or any previous visit.  No results found for this or any previous visit.  No results found for this or any previous visit.  No results found for this or any previous visit.  No results found for this or any previous visit.  No results found for this or any previous visit.    Cystoscopy Procedure Note  Patient identification was confirmed, informed consent was obtained, and patient was prepped using Betadine solution.  Lidocaine  jelly was administered per urethral meatus.     Pre-Procedure: - Inspection reveals a normal caliber ureteral meatus.  Procedure: The flexible cystoscope was introduced without difficulty - No urethral strictures/lesions are present. - Enlarged prostate  - Normal bladder neck - Bilateral ureteral orifices identified - Bladder mucosa  reveals no ulcers, tumors, or lesions - No bladder stones - No trabeculation     Post-Procedure: - Patient tolerated the procedure well   1. Malignant neoplasm of urinary bladder, unspecified site (HCC) (Primary) Followup 1 year for cystoscopy - Urinalysis, Routine w reflex microscopic - Cystoscopy (Bedside) - ciprofloxacin   (CIPRO ) tablet 500 mg  2. Prostate cancer (HCC) PSMA PET   No follow-ups on file.  Belvie Clara, MD  [facility] [place]

## 2023-06-05 ENCOUNTER — Encounter: Payer: Self-pay | Admitting: Urology

## 2023-06-05 NOTE — Patient Instructions (Signed)

## 2023-06-15 ENCOUNTER — Encounter (HOSPITAL_COMMUNITY): Payer: Medicare HMO

## 2023-06-22 ENCOUNTER — Encounter (HOSPITAL_COMMUNITY)
Admission: RE | Admit: 2023-06-22 | Discharge: 2023-06-22 | Disposition: A | Discharge status: Home / Self Care | Payer: Medicare HMO | Source: Ambulatory Visit | Attending: Urology | Admitting: Urology

## 2023-06-22 DIAGNOSIS — C61 Malignant neoplasm of prostate: Secondary | ICD-10-CM | POA: Diagnosis not present

## 2023-06-23 DIAGNOSIS — R918 Other nonspecific abnormal finding of lung field: Secondary | ICD-10-CM | POA: Diagnosis not present

## 2023-06-23 DIAGNOSIS — I517 Cardiomegaly: Secondary | ICD-10-CM | POA: Diagnosis not present

## 2023-06-23 DIAGNOSIS — I700 Atherosclerosis of aorta: Secondary | ICD-10-CM | POA: Diagnosis not present

## 2023-06-23 DIAGNOSIS — C61 Malignant neoplasm of prostate: Secondary | ICD-10-CM | POA: Diagnosis not present

## 2023-06-23 MED ORDER — FLOTUFOLASTAT F 18 GALLIUM 296-5846 MBQ/ML IV SOLN
8.2700 | Freq: Once | INTRAVENOUS | Status: AC
  Administered 2023-06-22: 8.27 via INTRAVENOUS
  Filled 2023-06-23: qty 9

## 2023-07-21 ENCOUNTER — Other Ambulatory Visit: Payer: BC Managed Care – PPO

## 2023-07-21 ENCOUNTER — Telehealth: Payer: Self-pay | Admitting: Urology

## 2023-07-21 NOTE — Telephone Encounter (Signed)
 See patient's wife's request.

## 2023-07-21 NOTE — Telephone Encounter (Signed)
 Patient wife wants you to release the PET scan to her- she is a Engineer, civil (consulting) and she would like to go over the results before they come in.

## 2023-07-28 ENCOUNTER — Ambulatory Visit: Payer: BC Managed Care – PPO | Admitting: Urology

## 2023-07-28 VITALS — BP 159/73 | HR 84

## 2023-07-28 DIAGNOSIS — R972 Elevated prostate specific antigen [PSA]: Secondary | ICD-10-CM | POA: Diagnosis not present

## 2023-07-28 DIAGNOSIS — C61 Malignant neoplasm of prostate: Secondary | ICD-10-CM | POA: Diagnosis not present

## 2023-07-28 LAB — URINALYSIS, ROUTINE W REFLEX MICROSCOPIC
Bilirubin, UA: NEGATIVE
Glucose, UA: NEGATIVE
Ketones, UA: NEGATIVE
Leukocytes,UA: NEGATIVE
Nitrite, UA: NEGATIVE
Protein,UA: NEGATIVE
RBC, UA: NEGATIVE
Specific Gravity, UA: 1.01 (ref 1.005–1.030)
Urobilinogen, Ur: 0.2 mg/dL (ref 0.2–1.0)
pH, UA: 6 (ref 5.0–7.5)

## 2023-07-28 MED ORDER — ORGOVYX 120 MG PO TABS: 120.0000 mg | ORAL_TABLET | Freq: Every day | ORAL | 11 refills | Status: AC

## 2023-07-28 NOTE — Progress Notes (Signed)
 07/28/2023 10:35 AM   Roger Vega [birth year] [id number]  Referring provider: Alston Jerry, MD [address] [place],  [place] [address]  Prostate cancer   HPI: Roger Vega is a 67yo here for followup fro prostate cancer. PSMA PET scan shows uptake in the seminal vesical. He underwent brachytherapy in 02/2021.    PMH: Past Medical History:  Diagnosis Date   Arthritis    oa   Bladder cancer (HCC) 2016   History of COVID-19    8-10 months ago per wife on 03-11-2021   Hypercholesteremia    Lumbar herniated disc 2017   ? L 4 to L 5 no surgery done   PONV (postoperative nausea and vomiting)    nausea in car on way home dizzy and feels over medicated   Prostate cancer (HCC) 2022   Renal cyst    Social Anxiety    Tinnitus    not sure which ear   Wears glasses 03/11/2021    Surgical History: Past Surgical History:  Procedure Laterality Date   ANKLE ARTHRODESIS     age 25-bone frag,cyst-rt   cologard  2021   was negative   COLONOSCOPY  2011   CYSTOSCOPY N/A 03/15/2021   Procedure: CYSTOSCOPY;  Surgeon: Marco Severs, MD;  Location: [facility];  Service: Urology;  Laterality: N/A;   ORIF FINGER FRACTURE  06/15/2011   Procedure: OPEN REDUCTION INTERNAL FIXATION (ORIF) METACARPAL (FINGER) FRACTURE;  Surgeon: Kemp Patter, MD;  Location: [facility];  Service: Orthopedics;  Laterality: Right;  Proximal phalanx right little finger   RADIOACTIVE SEED IMPLANT N/A 03/15/2021   Procedure: RADIOACTIVE SEED IMPLANT/BRACHYTHERAPY IMPLANT;  Surgeon: Marco Severs, MD;  Location: [facility];  Service: Urology;  Laterality: N/A;   RETINAL DETACHMENT SURGERY Left 2016   laser in office   surgery for bladder cancer  2016    Home Medications:  Allergies as of 07/28/2023       Reactions   Nsaids Anaphylaxis        Medication List        Accurate as of July 28, 2023 10:35 AM. If you have any questions, ask your nurse  or doctor.          atorvastatin 40 MG tablet Commonly known as: LIPITOR Take 40 mg by mouth daily.   cyclobenzaprine 10 MG tablet Commonly known as: FLEXERIL Take 10 mg by mouth 3 (three) times daily as needed for muscle spasms.   sertraline 100 MG tablet Commonly known as: ZOLOFT Take 100 mg by mouth daily.   silodosin  8 MG Caps capsule Commonly known as: RAPAFLO  Take 1 capsule (8 mg total) by mouth daily with breakfast.   tamsulosin  0.4 MG Caps capsule Commonly known as: FLOMAX  Take 1 capsule (0.4 mg total) by mouth in the morning and at bedtime.        Allergies:  Allergies  Allergen Reactions   Nsaids Anaphylaxis    Family History: Family History  Problem Relation Age of Onset   Heart disease Father    Lung cancer Other     Social History:  reports that he quit smoking about 47 years ago. His smoking use included cigarettes. He has never used smokeless tobacco. He reports that he does not drink alcohol and does not use drugs.  ROS: All other review of systems were reviewed and are negative except what is noted above in HPI  Physical Exam: BP (!) 159/73   Pulse  84   Constitutional:  Alert and oriented, No acute distress. HEENT: [place] AT, moist mucus membranes.  Trachea midline, no masses. Cardiovascular: No clubbing, cyanosis, or edema. Respiratory: Normal respiratory effort, no increased work of breathing. GI: Abdomen is soft, nontender, nondistended, no abdominal masses GU: No CVA tenderness.  Lymph: No cervical or inguinal lymphadenopathy. Skin: No rashes, bruises or suspicious lesions. Neurologic: Grossly intact, no focal deficits, moving all 4 extremities. Psychiatric: Normal mood and affect.  Laboratory Data: Lab Results  Component Value Date   WBC 5.6 03/12/2021   HGB 15.1 03/12/2021   HCT 45.1 03/12/2021   MCV 85.7 03/12/2021   PLT 276 03/12/2021    Lab Results  Component Value Date   CREATININE 0.83 03/12/2021    Lab Results   Component Value Date   PSA 6.7 (H) 08/27/2019    No results found for: "TESTOSTERONE"  No results found for: "HGBA1C"  Urinalysis    Component Value Date/Time   APPEARANCEUR Clear 05/31/2023 1007   GLUCOSEU Negative 05/31/2023 1007   BILIRUBINUR Negative 05/31/2023 1007   PROTEINUR Negative 05/31/2023 1007   UROBILINOGEN negative (A) 08/27/2019 1102   NITRITE Negative 05/31/2023 1007   LEUKOCYTESUR Negative 05/31/2023 1007    Lab Results  Component Value Date   LABMICR Comment 05/31/2023    Pertinent Imaging: PSMA PET: Images reviewed and discussed with the patient  No results found for this or any previous visit.  No results found for this or any previous visit.  No results found for this or any previous visit.  No results found for this or any previous visit.  No results found for this or any previous visit.  No results found for this or any previous visit.  No results found for this or any previous visit.  No results found for this or any previous visit.   Assessment & Plan:    1. Prostate cancer [facility]) Referral to Dr. Lorri Rota for possible IMRT to SVs. We discussed starting ADT, risks/benefits/alternatives to ADT therapy was explained to the patient and he understands and wishes to proceed with therapy.`      No follow-ups on file.  Roger Nailer, MD  [facility] [place]

## 2023-07-31 ENCOUNTER — Telehealth: Payer: Self-pay | Admitting: Urology

## 2023-07-31 NOTE — Telephone Encounter (Signed)
 Was prescribed Orgovyx and cannot afford $1000 a month

## 2023-07-31 NOTE — Telephone Encounter (Signed)
 FYI

## 2023-08-08 NOTE — Progress Notes (Signed)
 Nursing interview for a diagnosis of Reoccurring Adenocarcinoma of the Prostate.    Patient identity verified x2.   Patient reports ***   Pain: *** Abdominal issues: *** Urinary issues: *** Rectal issues: *** Lower extremity issues: ***   All related body systems reviewed w/ patient.  Patient denies all other related issues at this time.   Meaningful use complete.   I-PSS (AUA) score- *** - {(BH) RANGE ABSENT/SEVERE:20013:s} SHIM (ED) score- *** Urinary Management medication(s) Tamsulosin Urology appointment date- 09/13/2023 9:10am with Dr. Claretta Croft at [facility] [place]   Vitals: ***   Additional details:   SAFETY ISSUES: Prior radiation? Radiation Seeds 03/15/21: Insertion of radioactive I-125 seeds into the prostate gland; 145 Gy, definitive therapy without placement of SpaceOAR gel  Pacemaker/ICD? No Possible current pregnancy? N/A Male Is the patient on methotrexate? No  06/22/2023 PET scan impressions are as follows:    IMPRESSION: 1. Medial left seminal vesicle tracer affinity, most consistent with residual/locally recurrent disease. 2. More diffuse, low-level affinity throughout the prostate in the setting of radiation seeds, nonspecific. 3. No nodal or distant tracer avid metastasis. 4. Tiny bilateral pulmonary nodules are similar to 01/11/2021 and considered benign. 5.  Aortic Atherosclerosis (ICD10-I70.0).   This concludes the interaction.  Avery Bodo, LPN

## 2023-08-10 ENCOUNTER — Ambulatory Visit
Admission: RE | Admit: 2023-08-10 | Discharge: 2023-08-10 | Disposition: A | Discharge status: Home / Self Care | Source: Ambulatory Visit | Attending: Urology | Admitting: Urology

## 2023-08-10 ENCOUNTER — Encounter: Payer: Self-pay | Admitting: Urology

## 2023-08-10 VITALS — Ht 71.0 in | Wt 185.0 lb

## 2023-08-10 DIAGNOSIS — C61 Malignant neoplasm of prostate: Secondary | ICD-10-CM | POA: Insufficient documentation

## 2023-08-10 DIAGNOSIS — Z191 Hormone sensitive malignancy status: Secondary | ICD-10-CM | POA: Diagnosis not present

## 2023-08-10 NOTE — Progress Notes (Signed)
 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    ICD-10-CM   1. Malignant neoplasm of prostate (HCC)  C61     2. Prostate cancer (HCC)  C61     3. Recurrent prostate adenocarcinoma (HCC)  C61       HISTORY OF PRESENT ILLNESS: Roger Vega is a 68 y.o. male seen at the request of Dr. Ronne Binning.  He is well-known to our service, having previously undergone brachytherapy in November 2022 for T1c, Gleason 4+3 prostate cancer with pretreatment PSA of 12.7.  His PSA nadired at 2.4 in September 2023 but has been gradually rising since that time.  The PSA was 3.4 in June 2024 and reached 4.5 in September 2024.  At that time, he had a repeat transrectal ultrasound biopsy of the prostate on 02/06/2023 that was negative.  The PSA continued rising, at 5.3 in January 2025 prompting a PSMA PET scan for disease restaging on 06/22/2023.  This study showed medial left seminal vesicle tracer affinity, most consistent with residual/locally recurrent disease.  No nodal or distant tracer avid metastases noted and tiny bilateral pulmonary nodules are similar as compared to a prior CT A/P study from 01/11/2021 and considered benign.  He was started on Orgovyx ADT on 07/28/2023 and has kindly been referred to [place] today for consideration of salvage radiation to the PET positive disease in the left seminal vesicle.  PREVIOUS RADIATION THERAPY: Yes  03/15/21:  Insertion of radioactive I-125 seeds into the prostate gland; 145 Gy, definitive therapy without placement of SpaceOAR gel- insurance  denied.   PAST MEDICAL HISTORY:  Past Medical History:  Diagnosis Date   Arthritis    oa   Bladder cancer (HCC) 2016   History of COVID-19    8-10 months ago per wife on 03-11-2021   Hypercholesteremia    Lumbar herniated disc 2017   ? L 4 to L 5 no surgery done   PONV (postoperative nausea and vomiting)    nausea in car on way home dizzy and feels over medicated   Prostate cancer (HCC) 2022   Renal cyst    Social Anxiety    Tinnitus    not sure which ear   Wears glasses 03/11/2021      PAST SURGICAL HISTORY: Past Surgical History:  Procedure Laterality Date   ANKLE ARTHRODESIS     age 62-bone frag,cyst-rt   cologard  2021   was negative   COLONOSCOPY  2011   CYSTOSCOPY N/A 03/15/2021   Procedure: CYSTOSCOPY;  Surgeon: Malen Gauze, MD;  Location: [facility];  Service: Urology;  Laterality: N/A;   ORIF FINGER FRACTURE  06/15/2011   Procedure: OPEN REDUCTION INTERNAL FIXATION (ORIF) METACARPAL (FINGER) FRACTURE;  Surgeon: Nicki Reaper, MD;  Location: [facility];  Service: Orthopedics;  Laterality: Right;  Proximal phalanx right little finger   RADIOACTIVE SEED IMPLANT N/A 03/15/2021   Procedure: RADIOACTIVE SEED IMPLANT/BRACHYTHERAPY IMPLANT;  Surgeon: Malen Gauze, MD;  Location: [facility];  Service: Urology;  Laterality: N/A;   RETINAL DETACHMENT SURGERY Left 2016   laser in office  surgery for bladder cancer  2016    FAMILY HISTORY:  Family History  Problem Relation Age of Onset   Heart disease Father    Lung cancer Other     SOCIAL HISTORY:  Social History   Socioeconomic History   Marital status: Married    Spouse name: Not on file   Number of children: Not on file   Years of education: Not on file   Highest education level: Not on file  Occupational History   Not on file  Tobacco Use   Smoking status: Former    Current packs/day: 0.00    Types: Cigarettes    Quit date: 06/13/1976     Years since quitting: 47.1   Smokeless tobacco: Never  Vaping Use   Vaping status: Never Used  Substance and Sexual Activity   Alcohol use: No   Drug use: No   Sexual activity: Not on file  Other Topics Concern   Not on file  Social History Narrative   Not on file   Social Drivers of Health   Financial Resource Strain: Not on file  Food Insecurity: No Food Insecurity (08/10/2023)   Hunger Vital Sign    Worried About Running Out of Food in the Last Year: Never true    Ran Out of Food in the Last Year: Never true  Transportation Needs: No Transportation Needs (08/10/2023)   PRAPARE - Administrator, Civil Service (Medical): No    Lack of Transportation (Non-Medical): No  Physical Activity: Not on file  Stress: Not on file  Social Connections: Not on file  Intimate Partner Violence: Not At Risk (08/10/2023)   Humiliation, Afraid, Rape, and Kick questionnaire    Fear of Current or Ex-Partner: No    Emotionally Abused: No    Physically Abused: No    Sexually Abused: No    ALLERGIES: Nsaids  MEDICATIONS:  Current Outpatient Medications  Medication Sig Dispense Refill   atorvastatin (LIPITOR) 40 MG tablet Take 40 mg by mouth daily.     cyclobenzaprine (FLEXERIL) 10 MG tablet Take 10 mg by mouth 3 (three) times daily as needed for muscle spasms.     relugolix (ORGOVYX) 120 MG tablet Take 1 tablet (120 mg total) by mouth daily. 30 tablet 11   sertraline (ZOLOFT) 100 MG tablet Take 100 mg by mouth daily.     silodosin (RAPAFLO) 8 MG CAPS capsule Take 1 capsule (8 mg total) by mouth daily with breakfast. 30 capsule 5   tamsulosin (FLOMAX) 0.4 MG CAPS capsule Take 1 capsule (0.4 mg total) by mouth in the morning and at bedtime. (Patient not taking: Reported on 05/31/2023) 60 capsule 11   No current facility-administered medications for this encounter.    REVIEW OF SYSTEMS:  On review of systems, the patient reports that he is doing well overall.  He denies any chest pain,  shortness of breath, cough, fevers, chills, night sweats, unintended weight changes.  He denies any bowel or bladder disturbances, and denies abdominal pain, nausea or vomiting.  His IPSS score is 5, indicating minimal urinary symptoms.  His SHIM score is 15, indicating minimal-moderate erectile dysfunction.  He denies any new musculoskeletal or joint aches or pains. A complete review of systems is obtained and is otherwise negative.   PHYSICAL EXAM:  Wt Readings from Last 3 Encounters:  08/10/23 185 lb (83.9 kg)  09/29/21 185 lb (83.9 kg)  04/09/21 188 lb (85.3 kg)   Temp Readings from Last 3 Encounters:  02/06/23 98.7 F (37.1 C) (Oral)  04/09/21 98.2 F (36.8 C) (Temporal)  04/09/21 98.2 F (36.8 C)   BP Readings from Last 3 Encounters:  07/28/23 (!) 159/73  05/31/23 (!) 157/76  02/06/23 (!) 148/82   Pulse Readings from Last 3 Encounters:  07/28/23 84  05/31/23 76  02/06/23 72   Pain Assessment Pain Score: 0-No pain/10  Unable to assess due to telephone consult visit format.   KPS = 100  100 - Normal; no complaints; no evidence of disease. 90   - Able to carry on normal activity; minor signs or symptoms of disease. 80   - Normal activity with effort; some signs or symptoms of disease. 85   - Cares for self; unable to carry on normal activity or to do active work. 60   - Requires occasional assistance, but is able to care for most of his personal needs. 50   - Requires considerable assistance and frequent medical care. 40   - Disabled; requires special care and assistance. 30   - Severely disabled; hospital admission is indicated although death not imminent. 20   - Very sick; hospital admission necessary; active supportive treatment necessary. 10   - Moribund; fatal processes progressing rapidly. 0     - Dead  Karnofsky DA, Abelmann WH, Craver LS and Burchenal [facility] [phone]) The use of the nitrogen mustards in the palliative treatment of carcinoma: with particular reference  to bronchogenic carcinoma Cancer 1 634-56  LABORATORY DATA:  Lab Results  Component Value Date   WBC 5.6 03/12/2021   HGB 15.1 03/12/2021   HCT 45.1 03/12/2021   MCV 85.7 03/12/2021   PLT 276 03/12/2021   Lab Results  Component Value Date   NA 139 03/12/2021   K 4.1 03/12/2021   CL 106 03/12/2021   CO2 27 03/12/2021   Lab Results  Component Value Date   ALT 22 03/12/2021   AST 26 03/12/2021   ALKPHOS 58 03/12/2021   BILITOT 0.8 03/12/2021     RADIOGRAPHY: No results found.    IMPRESSION/PLAN:  This visit was conducted via telephone to spare the patient unnecessary potential exposure in the healthcare setting during the current COVID-19 pandemic. 1. 68 y.o. 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  Today, we talked to the patient and family about the findings and workup thus far. We discussed the natural history of locally recurrent prostate cancer and general treatment, highlighting the role of radiotherapy in the management. We discussed the available radiation techniques, and focused on the details and logistics of delivery.  The recommendation is for a 5 fraction course of stereotactic body radiotherapy (SBRT) to the PET positive disease in the left seminal vesicle, concurrent with ST-ADT for 6 months.  We reviewed the anticipated acute and late sequelae associated with radiation in this setting. The patient was encouraged to ask questions that were answered to his stated satisfaction and he is interested in proceeding so we will share our discussion with Dr. Ronne Binning.  He has provided verbal consent to proceed today and is tentatively scheduled for CT simulation/treatment planning at 11 AM on 08/11/2023, in anticipation of beginning his treatments in the near future.  He will sign formal written consent at the time of his CT simulation and a copy of that document will be placed in his medical record.  He is hoping to complete the 5 fraction  course of treatment prior to an upcoming family vacation May 7-9th.  We  enjoyed meeting with him and his wife again today and look forward to continuing to participate in his care.  Given current concerns for patient exposure during the COVID-19 pandemic, this encounter was conducted via telephone. The patient was notified in advance and was offered a WebEX meeting to allow for face to face communication but unfortunately reported that he/she did not have the appropriate resources/technology to support such a visit and instead preferred to proceed with telephone consult. The patient has given verbal consent for this type of encounter. The attendants for this meeting include Kenith Payer MD, Aniket Paye PA-C, patient, Ritchie Klee, and his wife, Ninette Basque. During the encounter, Kenith Payer MD, and Aviva Lemmings, were located at [facility] Radiation Oncology Department.  Patient, Roger Vega and his wife, Ninette Basque, were located at home.   We personally spent 70 minutes in this encounter including chart review, reviewing radiological studies, meeting face-to-face with the patient, entering orders and completing documentation.    Arta Bihari, PA-C    Kenith Payer, MD  [facility]  Radiation Oncology Direct Dial: [phone]  Fax: [phone] [email or web address]  Skype  LinkedIn

## 2023-08-11 ENCOUNTER — Ambulatory Visit
Admission: RE | Admit: 2023-08-11 | Discharge: 2023-08-11 | Disposition: A | Discharge status: Home / Self Care | Source: Ambulatory Visit | Attending: Radiation Oncology | Admitting: Radiation Oncology

## 2023-08-11 DIAGNOSIS — Z191 Hormone sensitive malignancy status: Secondary | ICD-10-CM | POA: Diagnosis not present

## 2023-08-11 DIAGNOSIS — Z510 Encounter for antineoplastic radiation therapy: Secondary | ICD-10-CM | POA: Diagnosis not present

## 2023-08-11 DIAGNOSIS — C61 Malignant neoplasm of prostate: Secondary | ICD-10-CM | POA: Diagnosis not present

## 2023-08-13 ENCOUNTER — Encounter: Payer: Self-pay | Admitting: Urology

## 2023-08-13 NOTE — Patient Instructions (Signed)

## 2023-08-15 NOTE — Progress Notes (Signed)
  Radiation Oncology         (336) [phone] ________________________________  Name: Roger Vega MRN: [id number]  Date: 08/11/2023  DOB: 04/26/1955  STEREOTACTIC BODY RADIOTHERAPY SIMULATION AND TREATMENT PLANNING NOTE    ICD-10-CM   1. 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  NARRATIVE:  The patient was brought to the CT Simulation planning suite.  Identity was confirmed.  All relevant records and images related to the planned course of therapy were reviewed.  The patient freely provided informed written consent to proceed with treatment after reviewing the details related to the planned course of therapy. The consent form was witnessed and verified by the simulation staff.  Then, the patient was set-up in a stable reproducible  supine position for radiation therapy.  A BodyFix immobilization pillow was fabricated for reproducible positioning.  Surface markings were placed.  The CT images were loaded into the planning software.  The gross target volumes (GTV) and planning target volumes (PTV) were delinieated, and avoidance structures were contoured.  Treatment planning then occurred.  The radiation prescription was entered and confirmed.  A total of two complex treatment devices were fabricated in the form of the BodyFix immobilization pillow and a neck accuform cushion.  I have requested : 3D Simulation  I have requested a DVH of the following structures: targets and all normal structures near the target including bladder, rectum and others as noted on the radiation plan to maintain doses in adherence with established limits  SPECIAL TREATMENT PROCEDURE:  The planned course of therapy using radiation constitutes a special treatment procedure. Special care is required in the management of this patient for the following reasons. High dose per fraction requiring special monitoring for  increased toxicities of treatment including daily imaging..  The special nature of the planned course of radiotherapy will require increased physician supervision and oversight to ensure patient's safety with optimal treatment outcomes.    This requires extended time and effort.    PLAN:  The patient will receive 36.25 Gy in 5 fractions.  ________________________________  Trilby Fujisawa Lorri Rota, M.D.

## 2023-08-16 ENCOUNTER — Ambulatory Visit: Admission: RE | Admit: 2023-08-16 | Source: Ambulatory Visit

## 2023-08-16 DIAGNOSIS — Z510 Encounter for antineoplastic radiation therapy: Secondary | ICD-10-CM | POA: Diagnosis not present

## 2023-08-16 DIAGNOSIS — Z191 Hormone sensitive malignancy status: Secondary | ICD-10-CM | POA: Diagnosis not present

## 2023-08-16 DIAGNOSIS — C61 Malignant neoplasm of prostate: Secondary | ICD-10-CM | POA: Diagnosis not present

## 2023-08-17 ENCOUNTER — Ambulatory Visit

## 2023-08-18 ENCOUNTER — Ambulatory Visit
Admission: RE | Admit: 2023-08-18 | Discharge: 2023-08-18 | Disposition: A | Discharge status: Home / Self Care | Source: Ambulatory Visit | Attending: Radiation Oncology | Admitting: Radiation Oncology

## 2023-08-18 ENCOUNTER — Other Ambulatory Visit: Payer: Self-pay

## 2023-08-18 DIAGNOSIS — C61 Malignant neoplasm of prostate: Secondary | ICD-10-CM | POA: Diagnosis not present

## 2023-08-18 DIAGNOSIS — Z510 Encounter for antineoplastic radiation therapy: Secondary | ICD-10-CM | POA: Diagnosis not present

## 2023-08-18 LAB — RAD ONC ARIA SESSION SUMMARY
Course Elapsed Days: 0
Plan Fractions Treated to Date: 1
Plan Prescribed Dose Per Fraction: 7.25 Gy
Plan Total Fractions Prescribed: 5
Plan Total Prescribed Dose: 36.25 Gy
Reference Point Dosage Given to Date: 7.25 Gy
Reference Point Session Dosage Given: 7.25 Gy
Session Number: 1

## 2023-08-21 ENCOUNTER — Other Ambulatory Visit: Payer: Self-pay

## 2023-08-21 ENCOUNTER — Ambulatory Visit
Admission: RE | Admit: 2023-08-21 | Discharge: 2023-08-21 | Disposition: A | Discharge status: Home / Self Care | Source: Ambulatory Visit | Attending: Radiation Oncology | Admitting: Radiation Oncology

## 2023-08-21 DIAGNOSIS — Z510 Encounter for antineoplastic radiation therapy: Secondary | ICD-10-CM | POA: Diagnosis not present

## 2023-08-21 DIAGNOSIS — C61 Malignant neoplasm of prostate: Secondary | ICD-10-CM | POA: Diagnosis not present

## 2023-08-21 LAB — RAD ONC ARIA SESSION SUMMARY
Course Elapsed Days: 3
Plan Fractions Treated to Date: 2
Plan Prescribed Dose Per Fraction: 7.25 Gy
Plan Total Fractions Prescribed: 5
Plan Total Prescribed Dose: 36.25 Gy
Reference Point Dosage Given to Date: 14.5 Gy
Reference Point Session Dosage Given: 7.25 Gy
Session Number: 2

## 2023-08-22 ENCOUNTER — Ambulatory Visit

## 2023-08-23 ENCOUNTER — Ambulatory Visit
Admission: RE | Admit: 2023-08-23 | Discharge: 2023-08-23 | Disposition: A | Discharge status: Home / Self Care | Source: Ambulatory Visit | Attending: Radiation Oncology

## 2023-08-23 ENCOUNTER — Other Ambulatory Visit: Payer: Self-pay

## 2023-08-23 DIAGNOSIS — Z510 Encounter for antineoplastic radiation therapy: Secondary | ICD-10-CM | POA: Diagnosis not present

## 2023-08-23 DIAGNOSIS — C61 Malignant neoplasm of prostate: Secondary | ICD-10-CM | POA: Diagnosis not present

## 2023-08-23 LAB — RAD ONC ARIA SESSION SUMMARY
Course Elapsed Days: 5
Plan Fractions Treated to Date: 3
Plan Prescribed Dose Per Fraction: 7.25 Gy
Plan Total Fractions Prescribed: 5
Plan Total Prescribed Dose: 36.25 Gy
Reference Point Dosage Given to Date: 21.75 Gy
Reference Point Session Dosage Given: 7.25 Gy
Session Number: 3

## 2023-08-24 ENCOUNTER — Ambulatory Visit

## 2023-08-24 NOTE — Addendum Note (Signed)
 Encounter addended by: Kenith Payer, MD on: 08/24/2023 2:45 PM  Actions taken: Problem List reviewed, Medication List reviewed, Allergies reviewed

## 2023-08-25 ENCOUNTER — Ambulatory Visit

## 2023-08-25 ENCOUNTER — Other Ambulatory Visit: Payer: Self-pay

## 2023-08-25 ENCOUNTER — Ambulatory Visit
Admission: RE | Admit: 2023-08-25 | Discharge: 2023-08-25 | Disposition: A | Discharge status: Home / Self Care | Source: Ambulatory Visit | Attending: Radiation Oncology | Admitting: Radiation Oncology

## 2023-08-25 DIAGNOSIS — C61 Malignant neoplasm of prostate: Secondary | ICD-10-CM | POA: Diagnosis not present

## 2023-08-25 DIAGNOSIS — Z510 Encounter for antineoplastic radiation therapy: Secondary | ICD-10-CM | POA: Diagnosis not present

## 2023-08-25 LAB — RAD ONC ARIA SESSION SUMMARY
Course Elapsed Days: 7
Plan Fractions Treated to Date: 4
Plan Prescribed Dose Per Fraction: 7.25 Gy
Plan Total Fractions Prescribed: 5
Plan Total Prescribed Dose: 36.25 Gy
Reference Point Dosage Given to Date: 29 Gy
Reference Point Session Dosage Given: 7.25 Gy
Session Number: 4

## 2023-08-28 ENCOUNTER — Ambulatory Visit

## 2023-08-29 ENCOUNTER — Other Ambulatory Visit: Payer: Self-pay

## 2023-08-29 ENCOUNTER — Ambulatory Visit
Admission: RE | Admit: 2023-08-29 | Discharge: 2023-08-29 | Disposition: A | Discharge status: Home / Self Care | Source: Ambulatory Visit | Attending: Radiation Oncology

## 2023-08-29 ENCOUNTER — Ambulatory Visit

## 2023-08-29 DIAGNOSIS — Z510 Encounter for antineoplastic radiation therapy: Secondary | ICD-10-CM | POA: Diagnosis not present

## 2023-08-29 DIAGNOSIS — Z191 Hormone sensitive malignancy status: Secondary | ICD-10-CM | POA: Diagnosis not present

## 2023-08-29 DIAGNOSIS — C61 Malignant neoplasm of prostate: Secondary | ICD-10-CM | POA: Diagnosis not present

## 2023-08-29 LAB — RAD ONC ARIA SESSION SUMMARY
Course Elapsed Days: 11
Plan Fractions Treated to Date: 5
Plan Prescribed Dose Per Fraction: 7.25 Gy
Plan Total Fractions Prescribed: 5
Plan Total Prescribed Dose: 36.25 Gy
Reference Point Dosage Given to Date: 36.25 Gy
Reference Point Session Dosage Given: 7.25 Gy
Session Number: 5

## 2023-08-30 NOTE — Radiation Completion Notes (Addendum)
  Radiation Oncology         [phone]) [phone] ________________________________  Name: Roger Vega MRN: [id number]  Date: 08/29/2023  DOB: 04/11/1956  Referring Physician: Johnie Nailer, M.D. Date of Service: 2023-08-30 Radiation Oncologist: Bartholome Ligas, M.D. 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   Intent: Curative     ==========DELIVERED PLANS==========  First Treatment Date: 2023-08-18 Last Treatment Date: 2023-08-29   Plan Name: Prostate_SBRT Site: Prostate Technique: SBRT/SRT-IMRT Mode: Photon Dose Per Fraction: 7.25 Gy Prescribed Dose (Delivered / Prescribed): 36.25 Gy / 36.25 Gy Prescribed Fxs (Delivered / Prescribed): 5 / 5     ==========ON TREATMENT VISIT DATES========== 2023-08-21, 2023-08-18, 2023-08-23, 2023-08-25, 2023-08-29, 2023-08-29    See weekly On Treatment Notes in Epic for details in the Media tab (listed as Progress notes on the On Treatment Visit Dates listed above).  He tolerated the treatments relatively well with only mildly increased LUTS and modest fatigue.  The patient will receive a call in about one month from the radiation oncology department. He will continue follow up with his urologist, Dr. Claretta Croft, as well.  ------------------------------------------------   Kenith Payer, MD [facility]  Radiation Oncology Direct Dial: [phone]  Fax: [phone] [email or web address]  Skype  LinkedIn

## 2023-09-07 ENCOUNTER — Other Ambulatory Visit

## 2023-09-07 DIAGNOSIS — R972 Elevated prostate specific antigen [PSA]: Secondary | ICD-10-CM

## 2023-09-08 LAB — PSA: Prostate Specific Ag, Serum: 0.4 ng/mL (ref 0.0–4.0)

## 2023-09-08 LAB — TESTOSTERONE: Testosterone: 5 ng/dL — ABNORMAL LOW (ref 264–916)

## 2023-09-13 ENCOUNTER — Ambulatory Visit (INDEPENDENT_AMBULATORY_CARE_PROVIDER_SITE_OTHER): Admitting: Urology

## 2023-09-13 ENCOUNTER — Encounter: Payer: Self-pay | Admitting: Urology

## 2023-09-13 ENCOUNTER — Other Ambulatory Visit: Payer: Self-pay | Admitting: Urology

## 2023-09-13 VITALS — BP 166/85 | HR 105

## 2023-09-13 DIAGNOSIS — C61 Malignant neoplasm of prostate: Secondary | ICD-10-CM

## 2023-09-13 DIAGNOSIS — R3915 Urgency of urination: Secondary | ICD-10-CM | POA: Diagnosis not present

## 2023-09-13 DIAGNOSIS — N401 Enlarged prostate with lower urinary tract symptoms: Secondary | ICD-10-CM

## 2023-09-13 DIAGNOSIS — N138 Other obstructive and reflux uropathy: Secondary | ICD-10-CM

## 2023-09-13 LAB — URINALYSIS, ROUTINE W REFLEX MICROSCOPIC
Bilirubin, UA: NEGATIVE
Glucose, UA: NEGATIVE
Ketones, UA: NEGATIVE
Leukocytes,UA: NEGATIVE
Nitrite, UA: NEGATIVE
Protein,UA: NEGATIVE
RBC, UA: NEGATIVE
Specific Gravity, UA: <1.005 — ABNORMAL LOW (ref 1.005–1.030)
Urobilinogen, Ur: 0.2 mg/dL (ref 0.2–1.0)
pH, UA: 6 (ref 5.0–7.5)

## 2023-09-13 LAB — BLADDER SCAN AMB NON-IMAGING: Scan Result: 118

## 2023-09-13 MED ORDER — GEMTESA 75 MG PO TABS: 1.0000 | ORAL_TABLET | Freq: Every day | ORAL | Status: DC

## 2023-09-13 NOTE — Progress Notes (Signed)
 post void residual= 118

## 2023-09-13 NOTE — Progress Notes (Signed)
 09/13/2023 8:50 AM   Roger Vega Sep 03, 1955 [id number]  Referring provider: Alston Jerry, MD [address] [place],  [place] [address]  Followup prostate cancer   HPI: Roger Vega is a 67yo here for followup for prostate cancer. PSA 0.4 and testosterone  5 on orgovyx . He finished SBRT for prostate cancer in his seminal vesical 1 week ago. IPSS 26 QOL 3 on rapaflo  8mg  daily. His LUTS have significantly worsened with radiation therapy. Nocturia 2x. His urine stream is weaker. He has urinary frequency every hour. He has urge incontinence and uses 3 pads per day which are damp. UA is normal   PMH: Past Medical History:  Diagnosis Date   Arthritis    oa   Bladder cancer (HCC) 2016   History of COVID-19    8-10 months ago per wife on 03-11-2021   Hypercholesteremia    Lumbar herniated disc 2017   ? L 4 to L 5 no surgery done   PONV (postoperative nausea and vomiting)    nausea in car on way home dizzy and feels over medicated   Prostate cancer (HCC) 2022   Renal cyst    Social Anxiety    Tinnitus    not sure which ear   Wears glasses 03/11/2021    Surgical History: Past Surgical History:  Procedure Laterality Date   ANKLE ARTHRODESIS     age 17-bone frag,cyst-rt   cologard  2021   was negative   COLONOSCOPY  2011   CYSTOSCOPY N/A 03/15/2021   Procedure: CYSTOSCOPY;  Surgeon: Marco Severs, MD;  [facility];  Service: Urology;  Laterality: N/A;   ORIF FINGER FRACTURE  06/15/2011   Procedure: OPEN REDUCTION INTERNAL FIXATION (ORIF) METACARPAL (FINGER) FRACTURE;  Surgeon: Kemp Patter, MD;  Location: [facility];  Service: Orthopedics;  Laterality: Right;  Proximal phalanx right little finger   RADIOACTIVE SEED IMPLANT N/A 03/15/2021   Procedure: RADIOACTIVE SEED IMPLANT/BRACHYTHERAPY IMPLANT;  Surgeon: Marco Severs, MD;  Location: [facility];  Service: Urology;  Laterality: N/A;   RETINAL DETACHMENT  SURGERY Left 2016   laser in office   surgery for bladder cancer  2016    Home Medications:  Allergies as of 09/13/2023       Reactions   Nsaids Anaphylaxis        Medication List        Accurate as of Sep 13, 2023  8:50 AM. If you have any questions, ask your nurse or doctor.          atorvastatin 40 MG tablet Commonly known as: LIPITOR Take 40 mg by mouth daily.   cyclobenzaprine 10 MG tablet Commonly known as: FLEXERIL Take 10 mg by mouth 3 (three) times daily as needed for muscle spasms.   Orgovyx  120 MG tablet Generic drug: relugolix  Take 1 tablet (120 mg total) by mouth daily.   sertraline 100 MG tablet Commonly known as: ZOLOFT Take 100 mg by mouth daily.   silodosin  8 MG Caps capsule Commonly known as: RAPAFLO  Take 1 capsule (8 mg total) by mouth daily with breakfast.   tamsulosin  0.4 MG Caps capsule Commonly known as: FLOMAX  Take 1 capsule (0.4 mg total) by mouth in the morning and at bedtime.        Allergies:  Allergies  Allergen Reactions   Nsaids Anaphylaxis    Family History: Family History  Problem Relation Age of Onset   Heart disease Father    Lung  cancer Other     Social History:  reports that he quit smoking about 47 years ago. His smoking use included cigarettes. He has never used smokeless tobacco. He reports that he does not drink alcohol and does not use drugs.  ROS: All other review of systems were reviewed and are negative except what is noted above in HPI  Physical Exam: BP (!) 166/85   Pulse (!) 105   Constitutional:  Alert and oriented, No acute distress. HEENT: [place] AT, moist mucus membranes.  Trachea midline, no masses. Cardiovascular: No clubbing, cyanosis, or edema. Respiratory: Normal respiratory effort, no increased work of breathing. GI: Abdomen is soft, nontender, nondistended, no abdominal masses GU: No CVA tenderness.  Lymph: No cervical or inguinal lymphadenopathy. Skin: No rashes, bruises or  suspicious lesions. Neurologic: Grossly intact, no focal deficits, moving all 4 extremities. Psychiatric: Normal mood and affect.  Laboratory Data: Lab Results  Component Value Date   WBC 5.6 03/12/2021   HGB 15.1 03/12/2021   HCT 45.1 03/12/2021   MCV 85.7 03/12/2021   PLT 276 03/12/2021    Lab Results  Component Value Date   CREATININE 0.83 03/12/2021    Lab Results  Component Value Date   PSA 6.7 (H) 08/27/2019    Lab Results  Component Value Date   TESTOSTERONE  5 (L) 09/07/2023    No results found for: "HGBA1C"  Urinalysis    Component Value Date/Time   APPEARANCEUR Clear 07/28/2023 1005   GLUCOSEU Negative 07/28/2023 1005   BILIRUBINUR Negative 07/28/2023 1005   PROTEINUR Negative 07/28/2023 1005   UROBILINOGEN negative (A) 08/27/2019 1102   NITRITE Negative 07/28/2023 1005   LEUKOCYTESUR Negative 07/28/2023 1005    Lab Results  Component Value Date   LABMICR Comment 07/28/2023    Pertinent Imaging:  No results found for this or any previous visit.  No results found for this or any previous visit.  No results found for this or any previous visit.  No results found for this or any previous visit.  No results found for this or any previous visit.  No results found for this or any previous visit.  No results found for this or any previous visit.  No results found for this or any previous visit.   Assessment & Plan:    1. Prostate cancer (HCC) (Primary) Continue orgovyx  Followup 3 months with PSA - Urinalysis, Routine w reflex microscopic  2. Benign prostatic hyperplasia with urinary obstruction Continue rapaflo  8mg  daily  3. Urinary urgency We will trial gemtesa 75mg  daily   No follow-ups on file.  Johnie Nailer, MD  [facility]rology [place]

## 2023-09-13 NOTE — Patient Instructions (Signed)

## 2023-09-19 ENCOUNTER — Encounter: Payer: Self-pay | Admitting: Urology

## 2023-09-20 ENCOUNTER — Other Ambulatory Visit: Payer: Self-pay

## 2023-09-20 DIAGNOSIS — R310 Gross hematuria: Secondary | ICD-10-CM

## 2023-09-21 ENCOUNTER — Other Ambulatory Visit

## 2023-09-21 DIAGNOSIS — R310 Gross hematuria: Secondary | ICD-10-CM | POA: Diagnosis not present

## 2023-09-21 LAB — URINALYSIS, ROUTINE W REFLEX MICROSCOPIC
Bilirubin, UA: NEGATIVE
Glucose, UA: NEGATIVE
Ketones, UA: NEGATIVE
Leukocytes,UA: NEGATIVE
Nitrite, UA: NEGATIVE
Specific Gravity, UA: 1.01 (ref 1.005–1.030)
Urobilinogen, Ur: 0.2 mg/dL (ref 0.2–1.0)
pH, UA: 7 (ref 5.0–7.5)

## 2023-09-21 LAB — MICROSCOPIC EXAMINATION
Bacteria, UA: NONE SEEN
RBC, Urine: >30 /HPF — AB (ref 0–2)

## 2023-09-22 NOTE — Progress Notes (Signed)
  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 (as documented in provider EOT note)  Pre Treatment IPSS Score: 5 (as documented in the provider consult note)  The patient was not available for call today. Voicemail left. Call marked complete.   Patient has a scheduled follow up visit with his urologist, Dr. Claretta Croft, on 10/2023 for ongoing surveillance. He was counseled that PSA levels will be drawn in the urology office, and was reassured that additional time is expected to improve bowel and bladder symptoms. He was encouraged to call back with concerns or questions regarding radiation.    Avery Bodo, LPN

## 2023-09-23 LAB — URINE CULTURE: Organism ID, Bacteria: NO GROWTH

## 2023-09-26 ENCOUNTER — Ambulatory Visit
Admission: RE | Admit: 2023-09-26 | Discharge: 2023-09-26 | Disposition: A | Discharge status: Home / Self Care | Source: Ambulatory Visit | Attending: Family Medicine | Admitting: Family Medicine

## 2023-09-26 ENCOUNTER — Ambulatory Visit: Payer: Self-pay

## 2023-10-02 ENCOUNTER — Encounter: Payer: Self-pay | Admitting: Urology

## 2023-10-03 MED ORDER — GEMTESA 75 MG PO TABS: 1.0000 | ORAL_TABLET | Freq: Every day | ORAL | 3 refills | Status: DC

## 2023-11-09 DIAGNOSIS — D559 Anemia due to enzyme disorder, unspecified: Secondary | ICD-10-CM | POA: Diagnosis not present

## 2023-11-09 DIAGNOSIS — E559 Vitamin D deficiency, unspecified: Secondary | ICD-10-CM | POA: Diagnosis not present

## 2023-11-09 DIAGNOSIS — Z0001 Encounter for general adult medical examination with abnormal findings: Secondary | ICD-10-CM | POA: Diagnosis not present

## 2023-11-09 DIAGNOSIS — Z1329 Encounter for screening for other suspected endocrine disorder: Secondary | ICD-10-CM | POA: Diagnosis not present

## 2023-11-09 DIAGNOSIS — E7849 Other hyperlipidemia: Secondary | ICD-10-CM | POA: Diagnosis not present

## 2023-11-21 DIAGNOSIS — B070 Plantar wart: Secondary | ICD-10-CM | POA: Diagnosis not present

## 2023-11-21 DIAGNOSIS — C61 Malignant neoplasm of prostate: Secondary | ICD-10-CM | POA: Diagnosis not present

## 2023-11-21 DIAGNOSIS — Z0001 Encounter for general adult medical examination with abnormal findings: Secondary | ICD-10-CM | POA: Diagnosis not present

## 2023-11-21 DIAGNOSIS — Z6826 Body mass index (BMI) 26.0-26.9, adult: Secondary | ICD-10-CM | POA: Diagnosis not present

## 2023-11-21 DIAGNOSIS — E559 Vitamin D deficiency, unspecified: Secondary | ICD-10-CM | POA: Diagnosis not present

## 2023-11-21 DIAGNOSIS — E782 Mixed hyperlipidemia: Secondary | ICD-10-CM | POA: Diagnosis not present

## 2023-12-05 DIAGNOSIS — Z1212 Encounter for screening for malignant neoplasm of rectum: Secondary | ICD-10-CM | POA: Diagnosis not present

## 2023-12-05 DIAGNOSIS — Z1211 Encounter for screening for malignant neoplasm of colon: Secondary | ICD-10-CM | POA: Diagnosis not present

## 2023-12-13 DIAGNOSIS — H40053 Ocular hypertension, bilateral: Secondary | ICD-10-CM | POA: Diagnosis not present

## 2023-12-13 DIAGNOSIS — H5201 Hypermetropia, right eye: Secondary | ICD-10-CM | POA: Diagnosis not present

## 2023-12-13 DIAGNOSIS — H524 Presbyopia: Secondary | ICD-10-CM | POA: Diagnosis not present

## 2023-12-13 DIAGNOSIS — H52222 Regular astigmatism, left eye: Secondary | ICD-10-CM | POA: Diagnosis not present

## 2023-12-13 DIAGNOSIS — H5212 Myopia, left eye: Secondary | ICD-10-CM | POA: Diagnosis not present

## 2023-12-14 ENCOUNTER — Other Ambulatory Visit

## 2023-12-14 DIAGNOSIS — C61 Malignant neoplasm of prostate: Secondary | ICD-10-CM | POA: Diagnosis not present

## 2023-12-14 LAB — COLOGUARD: COLOGUARD: NEGATIVE

## 2023-12-15 LAB — PSA: Prostate Specific Ag, Serum: <0.1 ng/mL (ref 0.0–4.0)

## 2023-12-19 ENCOUNTER — Ambulatory Visit: Payer: Self-pay | Admitting: Urology

## 2023-12-20 ENCOUNTER — Encounter: Payer: Self-pay | Admitting: Urology

## 2023-12-20 ENCOUNTER — Ambulatory Visit: Admitting: Urology

## 2023-12-20 VITALS — BP 122/73 | HR 66

## 2023-12-20 DIAGNOSIS — N138 Other obstructive and reflux uropathy: Secondary | ICD-10-CM | POA: Diagnosis not present

## 2023-12-20 DIAGNOSIS — R3915 Urgency of urination: Secondary | ICD-10-CM | POA: Diagnosis not present

## 2023-12-20 DIAGNOSIS — N401 Enlarged prostate with lower urinary tract symptoms: Secondary | ICD-10-CM

## 2023-12-20 DIAGNOSIS — C61 Malignant neoplasm of prostate: Secondary | ICD-10-CM

## 2023-12-20 LAB — URINALYSIS, ROUTINE W REFLEX MICROSCOPIC
Bilirubin, UA: NEGATIVE
Glucose, UA: NEGATIVE
Ketones, UA: NEGATIVE
Leukocytes,UA: NEGATIVE
Nitrite, UA: NEGATIVE
Protein,UA: NEGATIVE
RBC, UA: NEGATIVE
Specific Gravity, UA: 1.01 (ref 1.005–1.030)
Urobilinogen, Ur: 0.2 mg/dL (ref 0.2–1.0)
pH, UA: 6 (ref 5.0–7.5)

## 2023-12-20 MED ORDER — SILODOSIN 8 MG PO CAPS: 8.0000 mg | ORAL_CAPSULE | Freq: Every day | ORAL | 3 refills | Status: DC

## 2023-12-20 MED ORDER — GEMTESA 75 MG PO TABS: 1.0000 | ORAL_TABLET | Freq: Every day | ORAL | 3 refills | Status: DC

## 2023-12-20 NOTE — Progress Notes (Signed)
 12/20/2023 11:20 AM   Roger Vega 1955/07/29 [id number]  Referring provider: Lari Elspeth BRAVO, MD [address] [place],  [place] [address]     HPI: Roger Vega is a 67yo here for followup for BPH and prostate cancer. PSa is undetectable. He remains on orgovyx . IPSS 12 QOL 1 on rapaflo  and gemtesa . His urinary urgency is improved since last visit. He has intermittent urge incontinence and uses 1 pad per day. Uirne stream is fair. NO straining to urinate. He is due to stop orgovyx  in 01/2024.    PMH: Past Medical History:  Diagnosis Date   Arthritis    oa   Bladder cancer (HCC) 2016   History of COVID-19    8-10 months ago per wife on 03-11-2021   Hypercholesteremia    Lumbar herniated disc 2017   ? L 4 to L 5 no surgery done   PONV (postoperative nausea and vomiting)    nausea in car on way home dizzy and feels over medicated   Prostate cancer (HCC) 2022   Renal cyst    Social Anxiety    Tinnitus    not sure which ear   Wears glasses 03/11/2021    Surgical History: Past Surgical History:  Procedure Laterality Date   ANKLE ARTHRODESIS     age 46-bone frag,cyst-rt   cologard  2021   was negative   COLONOSCOPY  2011   CYSTOSCOPY N/A 03/15/2021   Procedure: CYSTOSCOPY;  Surgeon: Sherrilee Belvie CROME, MD;  Location: [facility];  Service: Urology;  Laterality: N/A;   ORIF FINGER FRACTURE  06/15/2011   Procedure: OPEN REDUCTION INTERNAL FIXATION (ORIF) METACARPAL (FINGER) FRACTURE;  Surgeon: Arley JONELLE Curia, MD;  Location: [facility];  Service: Orthopedics;  Laterality: Right;  Proximal phalanx right little finger   RADIOACTIVE SEED IMPLANT N/A 03/15/2021   Procedure: RADIOACTIVE SEED IMPLANT/BRACHYTHERAPY IMPLANT;  Surgeon: Sherrilee Belvie CROME, MD;  Location: [facility];  Service: Urology;  Laterality: N/A;   RETINAL DETACHMENT SURGERY Left 2016   laser in office   surgery for bladder cancer  2016    Home Medications:   Allergies as of 12/20/2023       Reactions   Nsaids Anaphylaxis        Medication List        Accurate as of December 20, 2023 11:20 AM. If you have any questions, ask your nurse or doctor.          atorvastatin 40 MG tablet Commonly known as: LIPITOR Take 40 mg by mouth daily.   cyclobenzaprine 10 MG tablet Commonly known as: FLEXERIL Take 10 mg by mouth 3 (three) times daily as needed for muscle spasms.   Gemtesa  75 MG Tabs Generic drug: Vibegron  Take 1 tablet (75 mg total) by mouth daily.   Gemtesa  75 MG Tabs Generic drug: Vibegron  Take 1 tablet (75 mg total) by mouth daily.   Orgovyx  120 MG tablet Generic drug: relugolix  Take 1 tablet (120 mg total) by mouth daily.   sertraline 100 MG tablet Commonly known as: ZOLOFT Take 100 mg by mouth daily.   silodosin  8 MG Caps capsule Commonly known as: RAPAFLO  TAKE 1 CAPSULE EVERY DAY WITH BREAKFAST   tamsulosin  0.4 MG Caps capsule Commonly known as: FLOMAX  Take 1 capsule (0.4 mg total) by mouth in the morning and at bedtime.        Allergies:  Allergies  Allergen Reactions   Nsaids Anaphylaxis    Family History:  Family History  Problem Relation Age of Onset   Heart disease Father    Lung cancer Other     Social History:  reports that he quit smoking about 47 years ago. His smoking use included cigarettes. He has never used smokeless tobacco. He reports that he does not drink alcohol and does not use drugs.  ROS: All other review of systems were reviewed and are negative except what is noted above in HPI  Physical Exam: BP 122/73   Pulse 66   Constitutional:  Alert and oriented, No acute distress. HEENT: [place] AT, moist mucus membranes.  Trachea midline, no masses. Cardiovascular: No clubbing, cyanosis, or edema. Respiratory: Normal respiratory effort, no increased work of breathing. GI: Abdomen is soft, nontender, nondistended, no abdominal masses GU: No CVA tenderness.  Lymph: No cervical or  inguinal lymphadenopathy. Skin: No rashes, bruises or suspicious lesions. Neurologic: Grossly intact, no focal deficits, moving all 4 extremities. Psychiatric: Normal mood and affect.  Laboratory Data: Lab Results  Component Value Date   WBC 5.6 03/12/2021   HGB 15.1 03/12/2021   HCT 45.1 03/12/2021   MCV 85.7 03/12/2021   PLT 276 03/12/2021    Lab Results  Component Value Date   CREATININE 0.83 03/12/2021    Lab Results  Component Value Date   PSA 6.7 (H) 08/27/2019    Lab Results  Component Value Date   TESTOSTERONE  5 (L) 09/07/2023    No results found for: HGBA1C  Urinalysis    Component Value Date/Time   APPEARANCEUR Cloudy (A) 09/21/2023 1043   GLUCOSEU Negative 09/21/2023 1043   BILIRUBINUR Negative 09/21/2023 1043   PROTEINUR 1+ (A) 09/21/2023 1043   UROBILINOGEN negative (A) 08/27/2019 1102   NITRITE Negative 09/21/2023 1043   LEUKOCYTESUR Negative 09/21/2023 1043    Lab Results  Component Value Date   LABMICR See below: 09/21/2023   WBCUA 0-5 09/21/2023   LABEPIT 0-10 09/21/2023   BACTERIA None seen 09/21/2023    Pertinent Imaging:  No results found for this or any previous visit.  No results found for this or any previous visit.  No results found for this or any previous visit.  No results found for this or any previous visit.  No results found for this or any previous visit.  No results found for this or any previous visit.  No results found for this or any previous visit.  No results found for this or any previous visit.   Assessment & Plan:    1. Prostate cancer (HCC) (Primary) Continue orgovyx  until 01/2024. Followup 3-4 months with a PSA - Urinalysis, Routine w reflex microscopic  2. Benign prostatic hyperplasia with urinary obstruction Continue rapaflo  8mg    3. Urinary urgency Continue gemtesa  75mg  daily   No follow-ups on file.  Belvie Clara, MD  [facility]  [place]

## 2023-12-20 NOTE — Patient Instructions (Signed)

## 2023-12-27 DIAGNOSIS — H40033 Anatomical narrow angle, bilateral: Secondary | ICD-10-CM | POA: Diagnosis not present

## 2023-12-27 DIAGNOSIS — H25093 Other age-related incipient cataract, bilateral: Secondary | ICD-10-CM | POA: Diagnosis not present

## 2023-12-27 DIAGNOSIS — H59812 Chorioretinal scars after surgery for detachment, left eye: Secondary | ICD-10-CM | POA: Diagnosis not present

## 2024-01-09 ENCOUNTER — Telehealth: Payer: Self-pay | Admitting: Urology

## 2024-01-09 ENCOUNTER — Telehealth: Payer: Self-pay

## 2024-01-09 ENCOUNTER — Ambulatory Visit

## 2024-01-09 DIAGNOSIS — N138 Other obstructive and reflux uropathy: Secondary | ICD-10-CM

## 2024-01-09 DIAGNOSIS — N401 Enlarged prostate with lower urinary tract symptoms: Secondary | ICD-10-CM | POA: Diagnosis not present

## 2024-01-09 LAB — MICROSCOPIC EXAMINATION
Bacteria, UA: NONE SEEN
WBC, UA: NONE SEEN /HPF (ref 0–5)

## 2024-01-09 LAB — URINALYSIS, ROUTINE W REFLEX MICROSCOPIC
Bilirubin, UA: NEGATIVE
Glucose, UA: NEGATIVE
Ketones, UA: NEGATIVE
Leukocytes,UA: NEGATIVE
Nitrite, UA: NEGATIVE
Protein,UA: NEGATIVE
Specific Gravity, UA: <1.005 — ABNORMAL LOW (ref 1.005–1.030)
Urobilinogen, Ur: 0.2 mg/dL (ref 0.2–1.0)
pH, UA: 6 (ref 5.0–7.5)

## 2024-01-09 NOTE — Progress Notes (Addendum)
 Bladder Scan completed today.  Patient can void prior to the bladder scan. Bladder scan result: 219  Performed By: Carlos, CMA  Additional notes-  Pt state's he has frequency, dripping, incomplete and hesitation. Pt state's he is taking medication Rapaflo  8 mg and Gemtesa  75 mg as prescribe.  Per Dr. Matilda pt is to increase Rapaflo  temporary ro BID for 1 week and call back in 1 week to informed of improvement or not.

## 2024-01-09 NOTE — Telephone Encounter (Signed)
 Having frequent urination and only a little comes out. Feels like he has to go and when he goes not much comes out and feels like he still needs to go.

## 2024-01-09 NOTE — Telephone Encounter (Signed)
 Return call to patient. Patient offer appointment for PVR check and patient scheduled. Verbalized understanding

## 2024-01-09 NOTE — Telephone Encounter (Signed)
 Patient presents today with complaints of  UTI.  UA and Culture done today.  Dr. Matilda  reviewed results and no treatment is needed .  Patient aware of MD recommendations and that we will reach out with culture results.      [email or web address], CMA

## 2024-01-11 LAB — URINE CULTURE: Organism ID, Bacteria: NO GROWTH

## 2024-01-12 ENCOUNTER — Ambulatory Visit: Payer: Self-pay | Admitting: Urology

## 2024-01-31 ENCOUNTER — Telehealth: Payer: Self-pay

## 2024-01-31 NOTE — Telephone Encounter (Signed)
 Return call to pt's pharmacy to confirmed Continue orgovyx  until 01/2024(12/20/2023 last OV) Roger Vega, Pharmacy was made aware and verbalized understanding. Pharmacy state's pt has a Rx for Orgovyx  120 mg with 11 refills, Roger Vega is made aware I will confirmed with Dr. Sherrilee and return call. Voiced understanding.

## 2024-02-02 NOTE — Telephone Encounter (Signed)
 Verbal from MD, McKenzie pt Roger Vega was completed in October,  spoke with Erick, making him aware and voiced understanding.

## 2024-03-25 DIAGNOSIS — Z6828 Body mass index (BMI) 28.0-28.9, adult: Secondary | ICD-10-CM | POA: Diagnosis not present

## 2024-03-25 DIAGNOSIS — B029 Zoster without complications: Secondary | ICD-10-CM | POA: Diagnosis not present

## 2024-05-01 ENCOUNTER — Other Ambulatory Visit

## 2024-05-01 DIAGNOSIS — C61 Malignant neoplasm of prostate: Secondary | ICD-10-CM

## 2024-05-02 LAB — PSA: Prostate Specific Ag, Serum: 0.3 ng/mL (ref 0.0–4.0)

## 2024-05-08 ENCOUNTER — Encounter: Payer: Self-pay | Admitting: Urology

## 2024-05-08 ENCOUNTER — Ambulatory Visit: Admitting: Urology

## 2024-05-08 VITALS — BP 133/75 | HR 67

## 2024-05-08 DIAGNOSIS — C61 Malignant neoplasm of prostate: Secondary | ICD-10-CM | POA: Diagnosis not present

## 2024-05-08 DIAGNOSIS — N138 Other obstructive and reflux uropathy: Secondary | ICD-10-CM | POA: Diagnosis not present

## 2024-05-08 DIAGNOSIS — N401 Enlarged prostate with lower urinary tract symptoms: Secondary | ICD-10-CM | POA: Diagnosis not present

## 2024-05-08 DIAGNOSIS — R3915 Urgency of urination: Secondary | ICD-10-CM

## 2024-05-08 LAB — URINALYSIS, ROUTINE W REFLEX MICROSCOPIC
Bilirubin, UA: NEGATIVE
Glucose, UA: NEGATIVE
Ketones, UA: NEGATIVE
Leukocytes,UA: NEGATIVE
Nitrite, UA: NEGATIVE
Protein,UA: NEGATIVE
RBC, UA: NEGATIVE
Specific Gravity, UA: 1.015 (ref 1.005–1.030)
Urobilinogen, Ur: 0.2 mg/dL (ref 0.2–1.0)
pH, UA: 6 (ref 5.0–7.5)

## 2024-05-08 MED ORDER — GEMTESA 75 MG PO TABS: 1.0000 | ORAL_TABLET | Freq: Every day | ORAL | 3 refills | Status: AC

## 2024-05-08 MED ORDER — SILODOSIN 8 MG PO CAPS: 8.0000 mg | ORAL_CAPSULE | Freq: Every day | ORAL | 3 refills | Status: AC

## 2024-05-08 NOTE — Patient Instructions (Signed)

## 2024-05-08 NOTE — Progress Notes (Signed)
 "  05/08/2024 10:53 AM   Daril Memos [birth year] [id number]  Referring provider: Lari Elspeth BRAVO, MD [address] [place],  [place] [address]  No chief complaint on file.   HPI: [name] is a 69yo here for followup for prostate cancer and BPH. PSA 0.3 off of orgovyx . In September he developed difficulty urinating while on vacation and was found to be retaining 250cc when he got back from vacation. He then stopped the rapoaflo and gemtesa  and his frequency decreased. He has since restarted both the medications. IPSS 16 QOL 2 on rapaflo  8mg  and gemtesa .  Uirne stream intermittently weak. No straining to urinate.    PMH: Past Medical History:  Diagnosis Date   Arthritis    oa   Bladder cancer (HCC) 2016   History of COVID-19    8-10 months ago per wife on 03-11-2021   Hypercholesteremia    Lumbar herniated disc 2017   ? L 4 to L 5 no surgery done   PONV (postoperative nausea and vomiting)    nausea in car on way home dizzy and feels over medicated   Prostate cancer (HCC) 2022   Renal cyst    Social Anxiety    Tinnitus    not sure which ear   Wears glasses 03/11/2021    Surgical History: Past Surgical History:  Procedure Laterality Date   ANKLE ARTHRODESIS     age 28-bone frag,cyst-rt   cologard  2021   was negative   COLONOSCOPY  2011   CYSTOSCOPY N/A 03/15/2021   Procedure: CYSTOSCOPY;  Surgeon: Sherrilee Belvie CROME, MD;  Location: [facility];  Service: Urology;  Laterality: N/A;   ORIF FINGER FRACTURE  06/15/2011   Procedure: OPEN REDUCTION INTERNAL FIXATION (ORIF) METACARPAL (FINGER) FRACTURE;  Surgeon: Arley JONELLE Curia, MD;  Location: [facility];  Service: Orthopedics;  Laterality: Right;  Proximal phalanx right little finger   RADIOACTIVE SEED IMPLANT N/A 03/15/2021   Procedure: RADIOACTIVE SEED IMPLANT/BRACHYTHERAPY IMPLANT;  Surgeon: Sherrilee Belvie CROME, MD;  Location: [facility];  Service: Urology;  Laterality: N/A;    RETINAL DETACHMENT SURGERY Left 2016   laser in office   surgery for bladder cancer  2016    Home Medications:  Allergies as of 05/08/2024       Reactions   Nsaids Anaphylaxis        Medication List        Accurate as of May 08, 2024 10:53 AM. If you have any questions, ask your nurse or doctor.          atorvastatin 40 MG tablet Commonly known as: LIPITOR Take 40 mg by mouth daily.   cyclobenzaprine 10 MG tablet Commonly known as: FLEXERIL Take 10 mg by mouth 3 (three) times daily as needed for muscle spasms.   Gemtesa  75 MG Tabs Generic drug: Vibegron  Take 1 tablet (75 mg total) by mouth daily.   Orgovyx  120 MG tablet Generic drug: relugolix  Take 1 tablet (120 mg total) by mouth daily.   sertraline 100 MG tablet Commonly known as: ZOLOFT Take 100 mg by mouth daily.   silodosin  8 MG Caps capsule Commonly known as: RAPAFLO  Take 1 capsule (8 mg total) by mouth daily with breakfast.   tamsulosin  0.4 MG Caps capsule Commonly known as: FLOMAX  Take 1 capsule (0.4 mg total) by mouth in the morning and at bedtime.        Allergies: Allergies[1]  Family History: Family History  Problem Relation Age  of Onset   Heart disease Father    Lung cancer Other     Social History:  reports that he quit smoking about 47 years ago. His smoking use included cigarettes. He has never used smokeless tobacco. He reports that he does not drink alcohol and does not use drugs.  ROS: All other review of systems were reviewed and are negative except what is noted above in HPI  Physical Exam: BP 133/75   Pulse 67   Constitutional:  Alert and oriented, No acute distress. HEENT: [place] AT, moist mucus membranes.  Trachea midline, no masses. Cardiovascular: No clubbing, cyanosis, or edema. Respiratory: Normal respiratory effort, no increased work of breathing. GI: Abdomen is soft, nontender, nondistended, no abdominal masses GU: No CVA tenderness.  Lymph: No cervical or  inguinal lymphadenopathy. Skin: No rashes, bruises or suspicious lesions. Neurologic: Grossly intact, no focal deficits, moving all 4 extremities. Psychiatric: Normal mood and affect.  Laboratory Data: Lab Results  Component Value Date   WBC 5.6 03/12/2021   HGB 15.1 03/12/2021   HCT 45.1 03/12/2021   MCV 85.7 03/12/2021   PLT 276 03/12/2021    Lab Results  Component Value Date   CREATININE 0.83 03/12/2021    Lab Results  Component Value Date   PSA 6.7 (H) 08/27/2019    Lab Results  Component Value Date   TESTOSTERONE  5 (L) 09/07/2023    No results found for: HGBA1C  Urinalysis    Component Value Date/Time   APPEARANCEUR Clear 01/09/2024 1131   GLUCOSEU Negative 01/09/2024 1131   BILIRUBINUR Negative 01/09/2024 1131   PROTEINUR Negative 01/09/2024 1131   UROBILINOGEN negative (A) 08/27/2019 1102   NITRITE Negative 01/09/2024 1131   LEUKOCYTESUR Negative 01/09/2024 1131    Lab Results  Component Value Date   LABMICR See below: 01/09/2024   WBCUA None seen 01/09/2024   LABEPIT 0-10 01/09/2024   BACTERIA None seen 01/09/2024    Pertinent Imaging:  No results found for this or any previous visit.  No results found for this or any previous visit.  No results found for this or any previous visit.  No results found for this or any previous visit.  No results found for this or any previous visit.  No results found for this or any previous visit.  No results found for this or any previous visit.  No results found for this or any previous visit.   Assessment & Plan:    1. Prostate cancer (HCC) (Primary) Followup 6 months with PSA - Urinalysis, Routine w reflex microscopic  2. Benign prostatic hyperplasia with urinary obstruction Continue rapaflo  8mg    3. Urinary urgency Continue gemtesa    No follow-ups on file.  Belvie Clara, MD  [facility] [place]       [1]  Allergies Allergen Reactions   Nsaids Anaphylaxis   "

## 2024-11-14 ENCOUNTER — Other Ambulatory Visit

## 2024-11-20 ENCOUNTER — Ambulatory Visit: Admitting: Urology
